# Patient Record
Sex: Male | Born: 1985 | Race: Black or African American | Hispanic: No | Marital: Single | Smoking: Current every day smoker
Health system: Southern US, Community
[De-identification: ages and names within clinical notes are randomized; demographics above are authoritative.]

## PROBLEM LIST (undated history)

## (undated) DIAGNOSIS — S42001A Fracture of unspecified part of right clavicle, initial encounter for closed fracture: Secondary | ICD-10-CM

## (undated) DIAGNOSIS — F329 Major depressive disorder, single episode, unspecified: Secondary | ICD-10-CM

## (undated) DIAGNOSIS — F32A Depression, unspecified: Secondary | ICD-10-CM

## (undated) DIAGNOSIS — F419 Anxiety disorder, unspecified: Secondary | ICD-10-CM

---
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
## 2009-01-03 ENCOUNTER — Emergency Department (HOSPITAL_COMMUNITY): Admission: EM | Admit: 2009-01-03 | Discharge: 2009-01-04 | Payer: Self-pay | Admitting: Emergency Medicine

## 2009-07-09 ENCOUNTER — Inpatient Hospital Stay (HOSPITAL_COMMUNITY): Admission: RE | Admit: 2009-07-09 | Discharge: 2009-07-12 | Payer: Self-pay | Admitting: Psychiatry

## 2009-07-09 ENCOUNTER — Ambulatory Visit: Payer: Self-pay | Admitting: Psychiatry

## 2009-07-12 ENCOUNTER — Emergency Department (HOSPITAL_COMMUNITY): Admission: EM | Admit: 2009-07-12 | Discharge: 2009-07-12 | Payer: Self-pay | Admitting: Emergency Medicine

## 2009-09-13 ENCOUNTER — Emergency Department (HOSPITAL_COMMUNITY): Admission: EM | Admit: 2009-09-13 | Discharge: 2009-09-13 | Payer: Self-pay | Admitting: Emergency Medicine

## 2009-09-15 ENCOUNTER — Other Ambulatory Visit: Payer: Self-pay

## 2009-09-15 ENCOUNTER — Inpatient Hospital Stay (HOSPITAL_COMMUNITY): Admission: RE | Admit: 2009-09-15 | Discharge: 2009-09-21 | Payer: Self-pay | Admitting: Psychiatry

## 2009-09-15 ENCOUNTER — Ambulatory Visit: Payer: Self-pay | Admitting: Psychiatry

## 2009-10-11 ENCOUNTER — Emergency Department (HOSPITAL_COMMUNITY): Admission: EM | Admit: 2009-10-11 | Discharge: 2009-10-15 | Payer: Self-pay | Admitting: Emergency Medicine

## 2010-11-20 LAB — CBC
HCT: 44 % (ref 39.0–52.0)
MCV: 89.7 fL (ref 78.0–100.0)
RBC: 4.91 MIL/uL (ref 4.22–5.81)
WBC: 7.1 10*3/uL (ref 4.0–10.5)

## 2010-11-20 LAB — DIFFERENTIAL
Basophils Absolute: 0 10*3/uL (ref 0.0–0.1)
Eosinophils Absolute: 0 10*3/uL (ref 0.0–0.7)
Lymphocytes Relative: 27 % (ref 12–46)
Lymphs Abs: 1.9 10*3/uL (ref 0.7–4.0)
Monocytes Absolute: 0.2 10*3/uL (ref 0.1–1.0)
Monocytes Relative: 2 % — ABNORMAL LOW (ref 3–12)

## 2010-11-20 LAB — BASIC METABOLIC PANEL
BUN: 10 mg/dL (ref 6–23)
CO2: 24 mEq/L (ref 19–32)
Calcium: 9.6 mg/dL (ref 8.4–10.5)
Creatinine, Ser: 0.97 mg/dL (ref 0.4–1.5)
GFR calc Af Amer: >60 mL/min (ref >60)

## 2010-11-20 LAB — TRICYCLICS SCREEN, URINE: TCA Scrn: NOT DETECTED

## 2010-11-20 LAB — HEMOCCULT GUIAC POC 1CARD (OFFICE): Fecal Occult Bld: NEGATIVE

## 2010-11-20 LAB — RAPID URINE DRUG SCREEN, HOSP PERFORMED: Benzodiazepines: NOT DETECTED

## 2010-11-24 LAB — BASIC METABOLIC PANEL
CO2: 25 mEq/L (ref 19–32)
Calcium: 9.6 mg/dL (ref 8.4–10.5)
GFR calc Af Amer: >60 mL/min (ref >60)
GFR calc non Af Amer: >60 mL/min (ref >60)
Glucose, Bld: 103 mg/dL — ABNORMAL HIGH (ref 70–99)
Potassium: 3.2 mEq/L — ABNORMAL LOW (ref 3.5–5.1)
Sodium: 143 mEq/L (ref 135–145)

## 2010-11-24 LAB — CBC
MCHC: 33.9 g/dL (ref 30.0–36.0)
RBC: 4.65 MIL/uL (ref 4.22–5.81)

## 2010-11-24 LAB — DIFFERENTIAL
Basophils Absolute: 0 10*3/uL (ref 0.0–0.1)
Basophils Relative: 1 % (ref 0–1)
Eosinophils Absolute: 0.2 10*3/uL (ref 0.0–0.7)
Eosinophils Relative: 3 % (ref 0–5)
Lymphs Abs: 2.7 10*3/uL (ref 0.7–4.0)
Monocytes Absolute: 0.6 10*3/uL (ref 0.1–1.0)
Neutro Abs: 4.1 10*3/uL (ref 1.7–7.7)

## 2010-11-24 LAB — RAPID URINE DRUG SCREEN, HOSP PERFORMED
Barbiturates: NOT DETECTED
Benzodiazepines: NOT DETECTED
Cocaine: NOT DETECTED

## 2010-11-24 LAB — TRICYCLICS SCREEN, URINE: TCA Scrn: NOT DETECTED

## 2010-12-07 LAB — COMPREHENSIVE METABOLIC PANEL
ALT: 21 U/L (ref 0–53)
Alkaline Phosphatase: 69 U/L (ref 39–117)
Calcium: 9.9 mg/dL (ref 8.4–10.5)
Chloride: 108 mEq/L (ref 96–112)
GFR calc Af Amer: >60 mL/min (ref >60)
Glucose, Bld: 95 mg/dL (ref 70–99)
Potassium: 4 mEq/L (ref 3.5–5.1)
Sodium: 141 mEq/L (ref 135–145)

## 2010-12-07 LAB — GC/CHLAMYDIA PROBE AMP, URINE
Chlamydia, Swab/Urine, PCR: NEGATIVE
GC Probe Amp, Urine: NEGATIVE

## 2010-12-07 LAB — URINALYSIS, ROUTINE W REFLEX MICROSCOPIC
Bilirubin Urine: NEGATIVE
Glucose, UA: NEGATIVE mg/dL
Hgb urine dipstick: NEGATIVE
Protein, ur: NEGATIVE mg/dL
Urobilinogen, UA: 0.2 mg/dL (ref 0.0–1.0)

## 2010-12-07 LAB — DRUGS OF ABUSE SCREEN W/O ALC, ROUTINE URINE
Amphetamine Screen, Ur: NEGATIVE
Barbiturate Quant, Ur: NEGATIVE
Creatinine,U: 227.3 mg/dL
Marijuana Metabolite: NEGATIVE
Methadone: NEGATIVE
Opiate Screen, Urine: NEGATIVE
Phencyclidine (PCP): NEGATIVE
Propoxyphene: NEGATIVE

## 2010-12-07 LAB — CBC
HCT: 45.3 % (ref 39.0–52.0)
MCHC: 33.2 g/dL (ref 30.0–36.0)
MCV: 90.1 fL (ref 78.0–100.0)
Platelets: 213 10*3/uL (ref 150–400)
RDW: 13.1 % (ref 11.5–15.5)

## 2010-12-07 LAB — RPR: RPR Ser Ql: NONREACTIVE
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
## 2012-02-27 ENCOUNTER — Emergency Department (HOSPITAL_COMMUNITY)
Admission: EM | Admit: 2012-02-27 | Discharge: 2012-02-27 | Disposition: A | Discharge status: Home / Self Care | Payer: Medicaid Other | Attending: Emergency Medicine | Admitting: Emergency Medicine

## 2012-02-27 ENCOUNTER — Encounter (HOSPITAL_COMMUNITY): Payer: Self-pay

## 2012-02-27 DIAGNOSIS — F3289 Other specified depressive episodes: Secondary | ICD-10-CM | POA: Insufficient documentation

## 2012-02-27 DIAGNOSIS — F329 Major depressive disorder, single episode, unspecified: Secondary | ICD-10-CM | POA: Insufficient documentation

## 2012-02-27 DIAGNOSIS — K120 Recurrent oral aphthae: Secondary | ICD-10-CM | POA: Insufficient documentation

## 2012-02-27 DIAGNOSIS — F17200 Nicotine dependence, unspecified, uncomplicated: Secondary | ICD-10-CM | POA: Insufficient documentation

## 2012-02-27 HISTORY — DX: Major depressive disorder, single episode, unspecified: F32.9

## 2012-02-27 HISTORY — DX: Depression, unspecified: F32.A

## 2012-02-27 HISTORY — DX: Fracture of unspecified part of right clavicle, initial encounter for closed fracture: S42.001A

## 2012-02-27 NOTE — ED Notes (Signed)
sts he thinks someone gave him something. Feels blisters on inside of mouth to both mucosal areas of inner cheeks.

## 2012-02-27 NOTE — ED Provider Notes (Signed)
History     CSN: [id number]  Arrival date & time 02/27/12  4098   First MD Initiated Contact with Patient 02/27/12 0830      Chief Complaint  Patient presents with  . oral blister     (Consider location/radiation/quality/duration/timing/severity/associated sxs/prior treatment) Patient is a 26 y.o. male presenting with mouth sores. The history is provided by the patient.  Mouth Lesions  The current episode started 3 to 5 days ago. The onset was gradual. The problem occurs continuously. The problem has been gradually worsening. The problem is mild. Nothing relieves the symptoms. Nothing aggravates the symptoms. Associated symptoms include mouth sores and rash (in mouth). Pertinent negatives include no fever, no abdominal pain, no constipation, no diarrhea, no nausea, no vomiting, no headaches, no neck pain, no cough and no wheezing. He has been behaving normally. He has been eating and drinking normally.    Past Medical History  Diagnosis Date  . Right clavicle fracture   . Depression     History reviewed. No pertinent past surgical history.  No family history on file.  History  Substance Use Topics  . Smoking status: Current Everyday Smoker -- 1.0 packs/day    Types: Cigarettes  . Smokeless tobacco: Not on file  . Alcohol Use: Yes     occasionally      Review of Systems  Constitutional: Negative for fever, chills and diaphoresis.  HENT: Positive for mouth sores. Negative for neck pain.   Respiratory: Negative for cough, chest tightness, shortness of breath and wheezing.   Cardiovascular: Negative for chest pain, palpitations and leg swelling.  Gastrointestinal: Negative for nausea, vomiting, abdominal pain, diarrhea and constipation.  Genitourinary: Negative for dysuria, frequency, discharge and difficulty urinating.  Skin: Positive for rash (in mouth). Negative for wound.  Neurological: Negative for dizziness, seizures, syncope, weakness, light-headedness, numbness  and headaches.  Hematological: Does not bruise/bleed easily.  All other systems reviewed and are negative.    Allergies  Review of patient's allergies indicates no known allergies.  Home Medications   Current Outpatient Rx  Name Route Sig Dispense Refill  . BUPROPION HCL 100 MG PO TABS Oral Take 100 mg by mouth 2 (two) times daily.      BP 127/70  Pulse 77  Temp 98.6 F (37 C) (Oral)  Resp 18  Ht 5\' 3"  (1.6 m)  Wt 133 lb (60.328 kg)  BMI 23.56 kg/m2  SpO2 100%  Physical Exam  Nursing note and vitals reviewed. Constitutional: He appears well-developed and well-nourished.  HENT:  Head: Normocephalic and atraumatic.  Right Ear: External ear normal.  Left Ear: External ear normal.  Nose: Nose normal.  Mouth/Throat: Oropharynx is clear and moist. No oropharyngeal exudate.  Eyes: Conjunctivae are normal. Pupils are equal, round, and reactive to light.  Neck: Normal range of motion. Neck supple.  Cardiovascular: Normal rate, regular rhythm, normal heart sounds and intact distal pulses.   Pulmonary/Chest: Effort normal and breath sounds normal. No respiratory distress. He has no wheezes. He has no rales. He exhibits no tenderness.  Musculoskeletal: Normal range of motion. He exhibits no edema and no tenderness.  Neurological: He is alert.  Skin: Skin is warm and dry. No rash noted. No erythema. No pallor.       Bilateral, symmetric 1.5 cm white ridges along bilateral molars   Psychiatric: He has a normal mood and affect. His behavior is normal. Judgment and thought content normal.    ED Course  Procedures (including critical care time)  Labs Reviewed - No data to display No results found.   1. Aphthous stomatitis       MDM  26 yo immunocompetent male presents for 4 days of symmetric bilateral oral sores, which appear on exam to be likely from his molars. Clinical picture not concerning for HSV or other infection. Patient counseled on use of topical mouthwashes for  symptomatic treatment. Patient given return precautions, including worsening of signs or symptoms. Patient instructed to follow-up with primary care physician.          Clemetine Marker, MD 02/27/12 [phone]

## 2012-02-27 NOTE — Discharge Instructions (Signed)
Stomatitis  Stomatitis is an inflammation of the mucous lining of the mouth. It can affect part of the mouth or the whole mouth. The intensity of symptoms can range from mild to severe. It can affect your cheek, teeth, gums, lips, or tongue. In almost all cases, the lining of the mouth becomes swollen, red, and painful. Painful ulcers can develop in your mouth. Stomatitis recurs in some people.  CAUSES   There are many common causes of stomatitis. They include:   Viruses (such as cold sores or shingles).   Canker sores.   Bacteria (such as ulcerative gingivitis or sexually transmitted diseases).   Fungus or yeast (such as candidiasis or oral thrush).   Poor oral hygiene and poor nutrition (Vincent's stomatitis or trench mouth).   Lack of vitamin B, vitamin C, or niacin.   Dentures or braces that do not fit properly.   High acid foods (uncommon).   Sharp or broken teeth.   Cheek biting.   Breathing through the mouth.   Chewing tobacco.   Allergy to toothpaste, mouthwash, candy, gum, lipstick, or some medicines.   Burning your mouth with hot drinks or food.   Exposure to dyes, heavy metals, acid fumes, or mineral dust.  SYMPTOMS    Painful ulcers in the mouth.   Blisters in the mouth.   Bleeding gums.   Swollen gums.   Irritability.   Bad breath.   Bad taste in the mouth.   Fever.   Trouble eating because of burning and pain in the mouth.  DIAGNOSIS   Your caregiver will examine your mouth and look for bleeding gums and mouth ulcers. Your caregiver may ask you about the medicines you are taking. Your caregiver may suggest a blood test and tissue sample (biopsy) of the mouth ulcer or mass if either is present. This will help find the cause of your condition.  TREATMENT   Your treatment will depend on the cause of your condition. Your caregiver will first try to treat your symptoms.    You may be given pain medicine. Topical anesthetic may be used to numb the area if you have severe  pain.   Your caregiver may prescribe antibiotic medicine if you have a bacterial infection.   Your caregiver may prescribe antifungal medicine if you have a fungal infection.   You may need to take antiviral medicine if you have a viral infection like herpes.   You may be asked to use medicated mouth rinses.   Your caregiver will advise you about proper brushing and using a soft toothbrush. You also need to get your teeth cleaned regularly.  HOME CARE INSTRUCTIONS    Maintain good oral hygiene. This is especially important for transplant patients.   Brush your teeth carefully with a soft, nylon-bristled toothbrush.   Floss at least 2 times a day.   Clean your mouth after eating.   Rinse your mouth with salt water 3 to 4 times a day.   Gargle with cold water.   Use topical numbing medicines to decrease pain if recommended by your caregiver.   Stop smoking, and stop using chewing or smokeless tobacco.   Avoid eating hot and spicy foods.   Eat soft and bland food.   Reduce your stress wherever possible.   Eat healthy and nutritious foods.  SEEK MEDICAL CARE IF:    Your symptoms persist or get worse.   You develop new symptoms.   Your mouth ulcers are present for more than 3   You have increasing difficulty with normal eating and drinking.   You have increasing fatigue or weakness.   You develop loss of appetite or nausea.  SEEK IMMEDIATE MEDICAL CARE IF:   You have a fever.   You develop pain, redness, or sores around one or both eyes.   You cannot eat or drink because of pain or other symptoms.   You develop worsening weakness, or you faint.   You develop vomiting or diarrhea.   You develop chest pain, shortness of breath, or rapid and irregular heartbeats.  MAKE SURE YOU:  Understand these instructions.   Will watch your condition.   Will get help right away if you are not doing well or get  worse.  Document Released: 06/18/2007 Document Revised: 08/10/2011 Document Reviewed: 03/30/2011 [facility] Patient Information 2012 [place], [place].Canker Sores  Canker sores are painful, open sores on the inside of the mouth and cheek. They may be white or yellow. The sores usually heal in 1 to 2 weeks. Women are more likely than men to have recurrent canker sores. CAUSES The cause of canker sores is not well understood. More than one cause is likely. Canker sores do not appear to be caused by certain types of germs (viruses or bacteria). Canker sores may be caused by:  An allergic reaction to certain foods.   Digestive problems.   Not having enough vitamin B12, folic acid, and iron.   Male sex hormones. Sores may come only during certain phases of a menstrual cycle. Often, there is improvement during pregnancy.   Genetics. Some people seem to inherit canker sore problems.  Emotional stress and injuries to the mouth may trigger outbreaks, but not cause them.  DIAGNOSIS Canker sores are diagnosed by exam.  TREATMENT  Patients who have frequent bouts of canker sores may have cultures taken of the sores, blood tests, or allergy tests. This helps determine if their sores are caused by a poor diet, an allergy, or some other preventable or treatable disease.   Vitamins may prevent recurrences or reduce the severity of canker sores in people with poor nutrition.   Numbing ointments can relieve pain. These are available in drug stores without a prescription.   Anti-inflammatory steroid mouth rinses or gels may be prescribed by your caregiver for severe sores.   Oral steroids may be prescribed if you have severe, recurrent canker sores. These strong medicines can cause many side effects and should be used only under the close direction of a dentist or physician.   Mouth rinses containing the antibiotic medicine may be prescribed. They may lessen symptoms and speed healing.  Healing usually  happens in about 1 or 2 weeks with or without treatment. Certain antibiotic mouth rinses given to pregnant women and young children can permanently stain teeth. Talk to your caregiver about your treatment. HOME CARE INSTRUCTIONS   Avoid foods that cause canker sores for you.   Avoid citrus juices, spicy or salty foods, and coffee until the sores are healed.   Use a soft-bristled toothbrush.   Chew your food carefully to avoid biting your cheek.   Apply topical numbing medicine to the sore to help relieve pain.   Apply a thin paste of baking soda and water to the sore to help heal the sore.   Only use mouth rinses or medicines for pain or discomfort as directed by your caregiver.  SEEK MEDICAL CARE IF:   Your symptoms are not better in 1 week.   Your sores are  still present after 2 weeks.   Your sores are very painful.   You have trouble breathing or swallowing.   Your sores come back frequently.  Document Released: 12/16/2010 Document Revised: 08/10/2011 Document Reviewed: 12/16/2010 [facility] Patient Information 2012 [employer]  YOU MAY USE SPRAYS CONTAINING BENZOCAINE TO HELP THE PAIN. DO NOT SWALLOW THESE SOLUTIONS; MAKE SURE TO SPIT THEM OUT. FOLLOW INSTRUCTIONS ON LABEL.

## 2012-02-27 NOTE — ED Provider Notes (Signed)
I saw and evaluated the patient, reviewed the resident's note and I agree with the findings and plan.  26yM with oral lesions. Not painful.  On exam small symmetric essentially linear lesions in horizontal plane in area next to 2nd molars b/l. No bleeding. No ulceration. No other oral lesions noted. Posterior pharynx clear. Neck supple and without adenopathy. Submental tissues soft. Dentition intact. Given appearance, locations and symmetry I suspect that this is just local irritation from adjacent molars. Lesions appear benign. Plan symptomatic tx with salt water rinses and monitoring. Return precautions discussed.  Raeford Razor, MD 02/27/12 [phone]

## 2012-03-04 NOTE — ED Provider Notes (Signed)
I saw and evaluated the patient, reviewed the resident's note and I agree with the findings and plan.  Please see completed note for this encounter.  Raeford Razor, MD 03/04/12 [phone]

## 2012-12-09 ENCOUNTER — Emergency Department (HOSPITAL_COMMUNITY)
Admission: EM | Admit: 2012-12-09 | Discharge: 2012-12-09 | Disposition: A | Discharge status: Home / Self Care | Payer: Medicaid Other | Attending: Emergency Medicine | Admitting: Emergency Medicine

## 2012-12-09 ENCOUNTER — Encounter (HOSPITAL_COMMUNITY): Payer: Self-pay | Admitting: Family Medicine

## 2012-12-09 DIAGNOSIS — F22 Delusional disorders: Secondary | ICD-10-CM

## 2012-12-09 DIAGNOSIS — F3289 Other specified depressive episodes: Secondary | ICD-10-CM | POA: Insufficient documentation

## 2012-12-09 DIAGNOSIS — F329 Major depressive disorder, single episode, unspecified: Secondary | ICD-10-CM

## 2012-12-09 DIAGNOSIS — F17200 Nicotine dependence, unspecified, uncomplicated: Secondary | ICD-10-CM | POA: Insufficient documentation

## 2012-12-09 DIAGNOSIS — F32A Depression, unspecified: Secondary | ICD-10-CM

## 2012-12-09 DIAGNOSIS — F419 Anxiety disorder, unspecified: Secondary | ICD-10-CM

## 2012-12-09 DIAGNOSIS — F411 Generalized anxiety disorder: Secondary | ICD-10-CM | POA: Insufficient documentation

## 2012-12-09 DIAGNOSIS — Z79899 Other long term (current) drug therapy: Secondary | ICD-10-CM | POA: Insufficient documentation

## 2012-12-09 DIAGNOSIS — R45851 Suicidal ideations: Secondary | ICD-10-CM | POA: Insufficient documentation

## 2012-12-09 DIAGNOSIS — Z8781 Personal history of (healed) traumatic fracture: Secondary | ICD-10-CM | POA: Insufficient documentation

## 2012-12-09 HISTORY — DX: Anxiety disorder, unspecified: F41.9

## 2012-12-09 LAB — COMPREHENSIVE METABOLIC PANEL
Albumin: 4.3 g/dL (ref 3.5–5.2)
BUN: 18 mg/dL (ref 6–23)
Chloride: 103 mEq/L (ref 96–112)
Creatinine, Ser: 1 mg/dL (ref 0.50–1.35)
GFR calc Af Amer: >90 mL/min (ref >90)
Total Bilirubin: 0.3 mg/dL (ref 0.3–1.2)
Total Protein: 7.3 g/dL (ref 6.0–8.3)

## 2012-12-09 LAB — ETHANOL: Alcohol, Ethyl (B): <11 mg/dL (ref 0–11)

## 2012-12-09 LAB — RAPID URINE DRUG SCREEN, HOSP PERFORMED: Benzodiazepines: NOT DETECTED

## 2012-12-09 LAB — CBC
HCT: 45.4 % (ref 39.0–52.0)
MCHC: 33.9 g/dL (ref 30.0–36.0)
MCV: 88 fL (ref 78.0–100.0)
RDW: 12.3 % (ref 11.5–15.5)

## 2012-12-09 LAB — URINALYSIS, ROUTINE W REFLEX MICROSCOPIC
Glucose, UA: NEGATIVE mg/dL
Ketones, ur: NEGATIVE mg/dL
Leukocytes, UA: NEGATIVE
Specific Gravity, Urine: 1.03 (ref 1.005–1.030)
pH: 5 (ref 5.0–8.0)

## 2012-12-09 LAB — SALICYLATE LEVEL: Salicylate Lvl: <2 mg/dL — ABNORMAL LOW (ref 2.8–20.0)

## 2012-12-09 MED ORDER — RISPERIDONE 2 MG PO TABS
4.0000 mg | ORAL_TABLET | Freq: Every day | ORAL | Status: DC
  Administered 2012-12-09: 4 mg via ORAL
  Filled 2012-12-09: qty 2

## 2012-12-09 MED ORDER — ARIPIPRAZOLE 5 MG PO TABS
5.0000 mg | ORAL_TABLET | Freq: Every day | ORAL | Status: DC
  Administered 2012-12-09: 5 mg via ORAL
  Filled 2012-12-09: qty 1

## 2012-12-09 MED ORDER — IBUPROFEN 200 MG PO TABS: 600.0000 mg | ORAL_TABLET | Freq: Three times a day (TID) | ORAL | Status: DC | PRN

## 2012-12-09 MED ORDER — NICOTINE 21 MG/24HR TD PT24
21.0000 mg | MEDICATED_PATCH | Freq: Every day | TRANSDERMAL | Status: DC
  Administered 2012-12-09: 21 mg via TRANSDERMAL
  Filled 2012-12-09: qty 1

## 2012-12-09 MED ORDER — ACETAMINOPHEN 325 MG PO TABS: 650.0000 mg | ORAL_TABLET | ORAL | Status: DC | PRN

## 2012-12-09 MED ORDER — ONDANSETRON HCL 4 MG PO TABS
4.0000 mg | ORAL_TABLET | Freq: Three times a day (TID) | ORAL | Status: DC | PRN
  Administered 2012-12-09: 4 mg via ORAL
  Filled 2012-12-09: qty 1

## 2012-12-09 MED ORDER — ZOLPIDEM TARTRATE 5 MG PO TABS: 5.0000 mg | ORAL_TABLET | Freq: Every evening | ORAL | Status: DC | PRN

## 2012-12-09 MED ORDER — ALUM & MAG HYDROXIDE-SIMETH 200-200-20 MG/5ML PO SUSP: 30.0000 mL | ORAL | Status: DC | PRN

## 2012-12-09 NOTE — [facility] Counselor (Signed)
Per telepsych recommendations; discharge home. Patient already has an established psychiatrist. Clinical research associate provided patient with additional follow up referrals.

## 2012-12-09 NOTE — ED Notes (Signed)
2 belongings bags located in [place] locker #[phone]
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
## 2012-12-09 NOTE — ED Notes (Signed)
Pt changed in to blue paper scrubs. Pt search and wanded by security. Pt with 1 pt belongings bag
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
## 2012-12-09 NOTE — ED Provider Notes (Signed)
The patient was assessed by psychiatry via teleconference.  Patient is stable for discharge. No inpatient psychiatric treatment was recommended.  Celene Kras, MD 12/09/12 1055

## 2012-12-10 NOTE — ED Provider Notes (Signed)
Medical screening examination/treatment/procedure(s) were performed by non-physician practitioner and as supervising physician I was immediately available for consultation/collaboration.  Sunnie Nielsen, MD 12/10/12 [phone]

## 2013-01-22 ENCOUNTER — Emergency Department (HOSPITAL_COMMUNITY): Payer: Medicaid Other

## 2013-01-22 ENCOUNTER — Emergency Department (HOSPITAL_COMMUNITY)
Admission: EM | Admit: 2013-01-22 | Discharge: 2013-01-23 | Disposition: A | Discharge status: Home / Self Care | Payer: Medicaid Other | Attending: Emergency Medicine | Admitting: Emergency Medicine

## 2013-01-22 DIAGNOSIS — Z79899 Other long term (current) drug therapy: Secondary | ICD-10-CM | POA: Insufficient documentation

## 2013-01-22 DIAGNOSIS — S79929A Unspecified injury of unspecified thigh, initial encounter: Secondary | ICD-10-CM | POA: Insufficient documentation

## 2013-01-22 DIAGNOSIS — F3289 Other specified depressive episodes: Secondary | ICD-10-CM | POA: Insufficient documentation

## 2013-01-22 DIAGNOSIS — Z23 Encounter for immunization: Secondary | ICD-10-CM | POA: Insufficient documentation

## 2013-01-22 DIAGNOSIS — S40812A Abrasion of left upper arm, initial encounter: Secondary | ICD-10-CM

## 2013-01-22 DIAGNOSIS — F329 Major depressive disorder, single episode, unspecified: Secondary | ICD-10-CM | POA: Insufficient documentation

## 2013-01-22 DIAGNOSIS — IMO0002 Reserved for concepts with insufficient information to code with codable children: Secondary | ICD-10-CM | POA: Insufficient documentation

## 2013-01-22 DIAGNOSIS — M79652 Pain in left thigh: Secondary | ICD-10-CM

## 2013-01-22 DIAGNOSIS — Y9389 Activity, other specified: Secondary | ICD-10-CM | POA: Insufficient documentation

## 2013-01-22 DIAGNOSIS — S50811A Abrasion of right forearm, initial encounter: Secondary | ICD-10-CM

## 2013-01-22 DIAGNOSIS — S79919A Unspecified injury of unspecified hip, initial encounter: Secondary | ICD-10-CM | POA: Insufficient documentation

## 2013-01-22 DIAGNOSIS — S0081XA Abrasion of other part of head, initial encounter: Secondary | ICD-10-CM

## 2013-01-22 DIAGNOSIS — Z8781 Personal history of (healed) traumatic fracture: Secondary | ICD-10-CM | POA: Insufficient documentation

## 2013-01-22 DIAGNOSIS — S50812A Abrasion of left forearm, initial encounter: Secondary | ICD-10-CM

## 2013-01-22 DIAGNOSIS — S0990XA Unspecified injury of head, initial encounter: Secondary | ICD-10-CM | POA: Insufficient documentation

## 2013-01-22 DIAGNOSIS — Y92410 Unspecified street and highway as the place of occurrence of the external cause: Secondary | ICD-10-CM | POA: Insufficient documentation

## 2013-01-22 DIAGNOSIS — F17200 Nicotine dependence, unspecified, uncomplicated: Secondary | ICD-10-CM | POA: Insufficient documentation

## 2013-01-22 DIAGNOSIS — F411 Generalized anxiety disorder: Secondary | ICD-10-CM | POA: Insufficient documentation

## 2013-01-22 MED ORDER — TETANUS-DIPHTH-ACELL PERTUSSIS 5-2.5-18.5 LF-MCG/0.5 IM SUSP
0.5000 mL | Freq: Once | INTRAMUSCULAR | Status: AC
  Administered 2013-01-22: 0.5 mL via INTRAMUSCULAR
  Filled 2013-01-22: qty 0.5

## 2013-01-22 MED ORDER — NAPROXEN 500 MG PO TABS: 500.0000 mg | ORAL_TABLET | Freq: Two times a day (BID) | ORAL | Status: DC

## 2013-01-22 NOTE — ED Notes (Signed)
Pt requesting meal tray.  Dr. Preston Fleeting states that the pt can have PO's.  Bag meal provided.  No changes in pt condition (stable).  No distress noted.  Pt continues to have a agitated demeanor but is still cooperative.

## 2013-01-22 NOTE — ED Notes (Signed)
Patient transported to X-ray 
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
## 2013-01-22 NOTE — ED Notes (Signed)
[employer] in with pt.  Pt loc no changes.  Pt is able to answer all questions appropriately.  Pt is agitated but cooperating.
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
## 2013-01-22 NOTE — Progress Notes (Signed)
Met pt shortly after arrival and offered to make calls for him. He was firm that he didn't want anyone called.  Marjory Lies Chaplain  01/22/13 1600  Clinical Encounter Type  Visited With Patient

## 2013-01-31 ENCOUNTER — Telehealth (HOSPITAL_COMMUNITY): Payer: Self-pay | Admitting: Licensed Clinical Social Worker

## 2013-01-31 ENCOUNTER — Emergency Department (HOSPITAL_COMMUNITY)
Admission: EM | Admit: 2013-01-31 | Discharge: 2013-01-31 | Disposition: A | Discharge status: Other Acute Inpatient Hospital | Payer: Medicaid Other | Attending: Emergency Medicine | Admitting: Emergency Medicine

## 2013-01-31 ENCOUNTER — Inpatient Hospital Stay (HOSPITAL_COMMUNITY)
Admission: AD | Admit: 2013-01-31 | Discharge: 2013-02-07 | DRG: 885 | Disposition: A | Discharge status: Home / Self Care | Payer: Medicaid Other | Source: Intra-hospital | Attending: Psychiatry | Admitting: Psychiatry

## 2013-01-31 ENCOUNTER — Encounter (HOSPITAL_COMMUNITY): Payer: Self-pay

## 2013-01-31 DIAGNOSIS — F259 Schizoaffective disorder, unspecified: Principal | ICD-10-CM | POA: Diagnosis present

## 2013-01-31 DIAGNOSIS — F411 Generalized anxiety disorder: Secondary | ICD-10-CM | POA: Insufficient documentation

## 2013-01-31 DIAGNOSIS — Z8781 Personal history of (healed) traumatic fracture: Secondary | ICD-10-CM | POA: Insufficient documentation

## 2013-01-31 DIAGNOSIS — F319 Bipolar disorder, unspecified: Secondary | ICD-10-CM | POA: Insufficient documentation

## 2013-01-31 DIAGNOSIS — Z9119 Patient's noncompliance with other medical treatment and regimen: Secondary | ICD-10-CM

## 2013-01-31 DIAGNOSIS — S51009A Unspecified open wound of unspecified elbow, initial encounter: Secondary | ICD-10-CM

## 2013-01-31 DIAGNOSIS — F29 Unspecified psychosis not due to a substance or known physiological condition: Secondary | ICD-10-CM | POA: Diagnosis present

## 2013-01-31 DIAGNOSIS — Z79899 Other long term (current) drug therapy: Secondary | ICD-10-CM

## 2013-01-31 DIAGNOSIS — Z91199 Patient's noncompliance with other medical treatment and regimen due to unspecified reason: Secondary | ICD-10-CM

## 2013-01-31 DIAGNOSIS — J40 Bronchitis, not specified as acute or chronic: Secondary | ICD-10-CM

## 2013-01-31 DIAGNOSIS — F3010 Manic episode without psychotic symptoms, unspecified: Secondary | ICD-10-CM

## 2013-01-31 DIAGNOSIS — R443 Hallucinations, unspecified: Secondary | ICD-10-CM | POA: Insufficient documentation

## 2013-01-31 DIAGNOSIS — F17200 Nicotine dependence, unspecified, uncomplicated: Secondary | ICD-10-CM | POA: Insufficient documentation

## 2013-01-31 LAB — RAPID URINE DRUG SCREEN, HOSP PERFORMED
Barbiturates: NOT DETECTED
Tetrahydrocannabinol: NOT DETECTED

## 2013-01-31 LAB — COMPREHENSIVE METABOLIC PANEL
ALT: 22 U/L (ref 0–53)
Alkaline Phosphatase: 80 U/L (ref 39–117)
CO2: 24 mEq/L (ref 19–32)
Calcium: 9.5 mg/dL (ref 8.4–10.5)
GFR calc Af Amer: >90 mL/min (ref >90)
GFR calc non Af Amer: >90 mL/min (ref >90)
Glucose, Bld: 109 mg/dL — ABNORMAL HIGH (ref 70–99)
Sodium: 141 mEq/L (ref 135–145)
Total Bilirubin: 0.5 mg/dL (ref 0.3–1.2)

## 2013-01-31 LAB — CBC
Hemoglobin: 13.7 g/dL (ref 13.0–17.0)
MCH: 28.6 pg (ref 26.0–34.0)
RBC: 4.79 MIL/uL (ref 4.22–5.81)

## 2013-01-31 MED ORDER — MENTHOL 3 MG MT LOZG
1.0000 | LOZENGE | OROMUCOSAL | Status: DC | PRN
  Administered 2013-02-03 – 2013-02-05 (×2): 3 mg via ORAL

## 2013-01-31 MED ORDER — IBUPROFEN 600 MG PO TABS: 600.0000 mg | ORAL_TABLET | Freq: Three times a day (TID) | ORAL | Status: DC | PRN

## 2013-01-31 MED ORDER — TRAZODONE HCL 50 MG PO TABS: 50.0000 mg | ORAL_TABLET | Freq: Every evening | ORAL | Status: DC | PRN

## 2013-01-31 MED ORDER — CARBAMAZEPINE ER 100 MG PO TB12
100.0000 mg | ORAL_TABLET | Freq: Two times a day (BID) | ORAL | Status: DC
  Filled 2013-01-31 (×4): qty 1

## 2013-01-31 MED ORDER — DEXTROMETHORPHAN POLISTIREX 30 MG/5ML PO LQCR
30.0000 mg | Freq: Two times a day (BID) | ORAL | Status: AC
  Administered 2013-02-01 – 2013-02-05 (×9): 30 mg via ORAL
  Filled 2013-01-31 (×12): qty 5

## 2013-01-31 MED ORDER — LORAZEPAM 2 MG/ML IJ SOLN: 1.0000 mg | Freq: Three times a day (TID) | INTRAMUSCULAR | Status: DC | PRN

## 2013-01-31 MED ORDER — ALUM & MAG HYDROXIDE-SIMETH 200-200-20 MG/5ML PO SUSP: 30.0000 mL | ORAL | Status: DC | PRN

## 2013-01-31 MED ORDER — CARBAMAZEPINE ER 100 MG PO TB12
100.0000 mg | ORAL_TABLET | Freq: Two times a day (BID) | ORAL | Status: DC
  Administered 2013-02-01: 100 mg via ORAL
  Filled 2013-01-31 (×5): qty 1

## 2013-01-31 MED ORDER — SILVER SULFADIAZINE 1 % EX CREA
TOPICAL_CREAM | Freq: Two times a day (BID) | CUTANEOUS | Status: DC
  Administered 2013-02-01 – 2013-02-03 (×5): via TOPICAL
  Filled 2013-01-31: qty 85
  Filled 2013-01-31: qty 50
  Filled 2013-01-31: qty 85

## 2013-01-31 MED ORDER — LORAZEPAM 1 MG PO TABS
1.0000 mg | ORAL_TABLET | Freq: Three times a day (TID) | ORAL | Status: DC | PRN
  Filled 2013-01-31: qty 1

## 2013-01-31 MED ORDER — RISPERIDONE 1 MG PO TABS: 1.0000 mg | ORAL_TABLET | Freq: Two times a day (BID) | ORAL | Status: DC

## 2013-01-31 MED ORDER — ZOLPIDEM TARTRATE 5 MG PO TABS: 5.0000 mg | ORAL_TABLET | Freq: Every evening | ORAL | Status: DC | PRN

## 2013-01-31 MED ORDER — MAGNESIUM HYDROXIDE 400 MG/5ML PO SUSP: 30.0000 mL | Freq: Every day | ORAL | Status: DC | PRN

## 2013-01-31 MED ORDER — NICOTINE 21 MG/24HR TD PT24
21.0000 mg | MEDICATED_PATCH | Freq: Every day | TRANSDERMAL | Status: DC
  Administered 2013-01-31: 21 mg via TRANSDERMAL

## 2013-01-31 MED ORDER — RISPERIDONE 1 MG PO TABS
1.0000 mg | ORAL_TABLET | Freq: Two times a day (BID) | ORAL | Status: DC
  Administered 2013-02-01 – 2013-02-02 (×4): 1 mg via ORAL
  Filled 2013-01-31 (×10): qty 1

## 2013-01-31 MED ORDER — LEVOFLOXACIN 500 MG PO TABS
500.0000 mg | ORAL_TABLET | Freq: Every day | ORAL | Status: AC
  Administered 2013-02-01 – 2013-02-06 (×6): 500 mg via ORAL
  Filled 2013-01-31 (×8): qty 1

## 2013-01-31 MED ORDER — ACETAMINOPHEN 325 MG PO TABS: 650.0000 mg | ORAL_TABLET | ORAL | Status: DC | PRN

## 2013-01-31 MED ORDER — MENTHOL 3 MG MT LOZG
1.0000 | LOZENGE | OROMUCOSAL | Status: DC | PRN
  Filled 2013-01-31: qty 9

## 2013-01-31 MED ORDER — ONDANSETRON HCL 4 MG PO TABS: 4.0000 mg | ORAL_TABLET | Freq: Three times a day (TID) | ORAL | Status: DC | PRN

## 2013-01-31 MED ORDER — ZIPRASIDONE MESYLATE 20 MG IM SOLR
20.0000 mg | Freq: Once | INTRAMUSCULAR | Status: AC
  Administered 2013-01-31: 20 mg via INTRAMUSCULAR
  Filled 2013-01-31: qty 20

## 2013-01-31 MED ORDER — SODIUM CHLORIDE 0.9 % IR SOLN
Freq: Two times a day (BID) | Status: AC
  Administered 2013-02-01 – 2013-02-05 (×8)
  Filled 2013-01-31 (×3): qty 500

## 2013-01-31 MED ORDER — ACETAMINOPHEN 325 MG PO TABS
650.0000 mg | ORAL_TABLET | Freq: Four times a day (QID) | ORAL | Status: DC | PRN
  Administered 2013-01-31: 650 mg via ORAL
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
## 2013-01-31 NOTE — ED Notes (Signed)
Writer explaining to patient unit rules and asking patient if he wants TV turned on. Writer also offering patient water to drink. Patient makes hand gestures to tell writer to  leave and states" shoo go ahead and leave". Writer tells patient that I'll be back with you a pitcher of water and remote to turn TV on.
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
## 2013-01-31 NOTE — BHH Counselor (Signed)
Patient accepted to [facility] by Julieanne Cotton, NP pending a 400 hall bed assignment.

## 2013-01-31 NOTE — ED Notes (Addendum)
Pt. In new blue scrubs. Pt. And belongings searched and wanded by security. Pt. Belongings locked up in the nurses station in triage. Pt. Has 1 belongings bag. Pt. Has shoes, shirt, pants, socks, underpants.pt. Has string necklace containing the symbol of a  Cross.

## 2013-01-31 NOTE — BHH Counselor (Signed)
Patient accepted to [facility] by Julieanne Cotton, NP to Dr. Jannifer Franklin. Patient's room assignment is 400-1. EDP-Dr. Linwood Dibbles made aware of patient's disposition. Patient's nurse-Patricia also made aware of patients disposition. The nursing report # is [phone]. All support paperwork completed and faxed to [facility]. Patient is under IVC and will need transport to [facility] [address] via [employer].
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
## 2013-01-31 NOTE — ED Provider Notes (Signed)
Filed Vitals:   01/31/13 0211  BP: 124/85  Pulse: 77  Temp:   Resp: 16   Sleeping this am.  Awaiting placement.  Celene Kras, MD 01/31/13 [phone]

## 2013-01-31 NOTE — ED Notes (Signed)
Writer in process of fixing patient water and getting remote and patient comes down hallway and [place], NT noted patient going into another patient room and throwing paper in trash can. MIchelle, NT tried to explain to patient to come out of room 43 and patient started yelling and exhibiting bizarre behavior. Patient told Marcelino Duster, [place] " to keep it rolling".
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
## 2013-01-31 NOTE — ED Notes (Signed)
Dr. Norlene Campbell contacted about patient behavior new orders received.

## 2013-01-31 NOTE — ED Provider Notes (Signed)
Medical screening examination/treatment/procedure(s) were performed by non-physician practitioner and as supervising physician I was immediately available for consultation/collaboration.  Nona Gracey M Shamon Cothran, MD 01/31/13 0712 

## 2013-02-01 MED ORDER — NICOTINE 21 MG/24HR TD PT24
21.0000 mg | MEDICATED_PATCH | Freq: Every day | TRANSDERMAL | Status: DC
  Administered 2013-02-01 – 2013-02-07 (×7): 21 mg via TRANSDERMAL
  Filled 2013-02-01 (×9): qty 1
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
## 2013-02-01 NOTE — Progress Notes (Signed)
Patient ID: Yaasir Menken, male   DOB: Mar 26, 1986, 26 y.o.   MRN: [id number] Psychoeducational Group Note  Date:  02/01/2013 Time:1000am  Group Topic/Focus:  Identifying Needs:   The focus of this group is to help patients identify their personal needs that have been historically problematic and identify healthy behaviors to address their needs.  Participation Level:  Active  Participation Quality:  Appropriate  Affect:  Anxious  Cognitive:  Appropriate  Insight:  Supportive  Engagement in Group:  Supportive  Additional Comments:  Inventory and Psychoeducational group   Valente David 02/01/2013,10:24 AM

## 2013-02-01 NOTE — Progress Notes (Addendum)
Patient ID: [name], male   DOB: May 19, 1986, 27 y.o.   MRN: [id number]  D: Pt denies SI/HI/AVH/ pain at this time. Pt is pleasant and cooperative. Pt still has pressured speech and is a little loud but is cooperative. Pt states he only will take Risperdal and Wellbutrin. Although pt denies AVH, pt appears to be experiencing internal stimuli.   A: Pt was offered support and encouragement. Pt was given scheduled medications. Pt was encourage to attend groups. Q 15 minute checks were done for safety.   R:Pt attends groups and interacts well with peers and staff. Pt is taking medication. Pt has no complaints at this time.Pt receptive to treatment and safety maintained on unit.

## 2013-02-01 NOTE — [facility] Group Notes (Signed)
[facility] Group Notes:  (Clinical Social Work)  02/01/2013  11:15-11:45AM  Summary of Progress/Problems:   The main focus of today's process group was for the patient to identify ways in which they have in the past sabotaged their own recovery and reasons they may have done this/what they received from doing it.  We then worked to identify a specific plan to avoid doing this when discharged from the hospital for this admission.  The patient expressed that he secludes himself and puts people off with the way he talks.  When he gets out of the hospital, he believes he can stay well if he keeps himself busy by going to the park, mall, etc. and "chilling,"  CSW suggested [facility] and he was very interested.  Type of Therapy:  Group Therapy - Process  Participation Level:  Active  Participation Quality:  Attentive and Sharing  Affect:  Blunted  Cognitive:  Appropriate and Oriented  Insight:  Developing/Improving  Engagement in Therapy:  Developing/Improving  Modes of Intervention:  Clarification, Education, Exploration, Discussion  Ambrose Mantle, LCSW 02/01/2013, 1:07 PM
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
## 2013-02-01 NOTE — Progress Notes (Signed)
D:  Patient irritable this morning, but has been much more pleasant as the day had progressed.  He has attended and participated in groups.  He initially refused medications this morning, but took them after speaking with Dr. Lolly Mustache.  He does appear to be responding to internal stimuli at times, but is more easily directed as the day has progressed.  A:  Medications given as scheduled.  Encouraged patient to attend groups and to come to staff with problems or concerns.  Dressing change done to wound on right forearm shortly after noon.  Wound is healing well and is without errythema or exudate.  Minimal drainage noted.   R:  Has become pleasant and cooperative as the day progressed.  Wound is healing well.  Interacting well with staff and peers at this time.  Safety is maintained on the unit.

## 2013-02-01 NOTE — [facility] Suicide Risk Assessment (Signed)
Suicide Risk Assessment  Admission Assessment     Nursing information obtained from:    Demographic factors:    Current Mental Status:    Loss Factors:    Historical Factors:    Risk Reduction Factors:     CLINICAL FACTORS:   Schizophrenia:   Less than 26 years old Paranoid or undifferentiated type Currently Psychotic Previous Psychiatric Diagnoses and Treatments  COGNITIVE FEATURES THAT CONTRIBUTE TO RISK:  Closed-mindedness Loss of executive function Polarized thinking Thought constriction (tunnel vision)    SUICIDE RISK:   Severe:  Frequent, intense, and enduring suicidal ideation, specific plan, no subjective intent, but some objective markers of intent (i.e., choice of lethal method), the method is accessible, some limited preparatory behavior, evidence of impaired self-control, severe dysphoria/symptomatology, multiple risk factors present, and few if any protective factors, particularly a lack of social support.  PLAN OF CARE:  I certify that inpatient services furnished can reasonably be expected to improve the patient's condition.  Meloney Feld T. 02/01/2013, 9:12 AM

## 2013-02-02 MED ORDER — RISPERIDONE 1 MG PO TABS
1.0000 mg | ORAL_TABLET | ORAL | Status: DC
  Administered 2013-02-02 – 2013-02-04 (×4): 1 mg via ORAL
  Filled 2013-02-02 (×8): qty 1

## 2013-02-02 MED ORDER — RISPERIDONE 1 MG PO TABS
1.0000 mg | ORAL_TABLET | ORAL | Status: DC
  Filled 2013-02-02: qty 1

## 2013-02-02 NOTE — Progress Notes (Signed)
Pt reports his sleep as well.  His appetite is good energy normal and ability to pay attention as improving.  He rated his his depression a 5 hopelessness 5 and anxiety a 3 on his self-inventory.  He denies any H/I he does admit to some passive S/I but has no plan here and does contact to come to staff.  Pt denies any A/V hallucinations. Pt plans to follow up with [facility] at time of discharge.

## 2013-02-02 NOTE — Progress Notes (Signed)
Patient ID: Shariq Puig, male   DOB: November 24, 1985, 27 y.o.   MRN: [id number] Psychoeducational Group Note  Date:  02/02/2013 Time:  1000am  Group Topic/Focus:  Making Healthy Choices:   The focus of this group is to help patients identify negative/unhealthy choices they were using prior to admission and identify positive/healthier coping strategies to replace them upon discharge.  Participation Level:  Active  Participation Quality:  Appropriate  Affect:  Appropriate  Cognitive:  Appropriate  Insight:  Supportive  Engagement in Group:  Supportive  Additional Comments:  Spiritual group   Valente David 02/02/2013,10:57 AM
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
## 2013-02-02 NOTE — Progress Notes (Signed)
D.  Pt. Denies SI/HI and denies A/V hallucinations.  Pt. Refused 1700 dose of Risperdal. A.  Q 15 minute checks continued for safety. R.  Pt. Remains safe.

## 2013-02-02 NOTE — [facility] Group Notes (Signed)
[facility] Group Notes: (Clinical Social Work)   02/02/2013      Type of Therapy:  Group Therapy   Participation Level:  Did Not Attend    Ambrose Mantle, LCSW 02/02/2013, 1:09 PM

## 2013-02-02 NOTE — Progress Notes (Signed)
[facility] MD Progress Note  02/02/2013 9:06 AM [name]  MRN:  [id number] Subjective:   I don't have any mental illness. Objective.   Patient seen and chart reviewed.  Patient continues to exhibit paranoia, hallucination and passive suicidal thinking.  He does not believe he has any psychiatric illness.  However he is compliant with the Risperdal.  He sleep on and off.  He was seen hypervigilant and guarded.  He was also seen some time talking to himself as appear he may be responding to internal stimuli.  He has attended some groups.  He has no tremors or shakes.  He did not provide much information about his past.  Diagnosis:   Axis I: Schizoaffective Disorder Axis II: Deferred Axis III:  Past Medical History  Diagnosis Date  . Right clavicle fracture   . Depression   . Anxiety    Axis IV: other psychosocial or environmental problems  ADL's:  Intact  Sleep: Fair  Appetite:  Fair  Suicidal Ideation:  Plan:  still passive suicidal thoughts Intent:  No Means:  Unknown Homicidal Ideation:  Plan:  Threatened to kill his mother. Intent:  None AEB (as evidenced by):  Psychiatric Specialty Exam: Review of Systems  Constitutional: Negative.   HENT: Negative.   Respiratory: Positive for cough. Negative for hemoptysis, sputum production, shortness of breath and wheezing.   Gastrointestinal: Negative.   Genitourinary: Negative.   Musculoskeletal: Negative.   Skin:       Multiple scratches but covered with bandages.   Neurological: Negative.   Psychiatric/Behavioral: Positive for hallucinations. The patient has insomnia.        Paranoid    Blood pressure 132/85, pulse 112, temperature 98.2 F (36.8 C), temperature source Oral, resp. rate 17, height 5\' 3"  (1.6 m), weight 58.06 kg (128 lb).Body mass index is 22.68 kg/(m^2).  General Appearance: Disheveled and Fairly Groomed  Patent attorney::  Fair  Speech:  Pressured  Volume:  Increased  Mood:  Irritable  Affect:  Constricted  and Labile  Thought Process:  Disorganized  Orientation:  Full (Time, Place, and Person)  Thought Content:  Hallucinations: Auditory and Paranoid Ideation  Suicidal Thoughts:  Yes.  without intent/plan  Homicidal Thoughts:  Yes.  without intent/plan  Memory:  Difficulty recalling things  Judgement:  Impaired  Insight:  Lacking  Psychomotor Activity:  Increased  Concentration:  Fair  Recall:  Poor  Akathisia:  No  Handed:  Right  AIMS (if indicated):     Assets:  Housing Physical Health  Sleep:  Number of Hours: 5.75   Current Medications: Current Facility-Administered Medications  Medication Dose Route Frequency Provider Last Rate Last Dose  . acetaminophen (TYLENOL) tablet 650 mg  650 mg Oral Q6H PRN Court Joy, PA-C   650 mg at 01/31/13 2227  . alum & mag hydroxide-simeth (MAALOX/MYLANTA) 200-200-20 MG/5ML suspension 30 mL  30 mL Oral Q4H PRN Earney Navy, NP      . dextromethorphan (DELSYM) 30 MG/5ML liquid 30 mg  30 mg Oral BID Court Joy, PA-C   30 mg at 02/02/13 1191  . hydrogen peroxide 3 % 10 application in sodium chloride irrigation 0.9 % 500 mL irrigation   Irrigation BID Court Joy, PA-C      . levofloxacin [facility]) tablet 500 mg  500 mg Oral Daily Court Joy, PA-C   500 mg at 02/02/13 4782  . LORazepam (ATIVAN) tablet 1 mg  1 mg Oral TID PRN Earney Navy, NP  Or  . LORazepam (ATIVAN) injection 1 mg  1 mg Intramuscular TID PRN Earney Navy, NP      . magnesium hydroxide (MILK OF MAGNESIA) suspension 30 mL  30 mL Oral Daily PRN Earney Navy, NP      . menthol-cetylpyridinium (CEPACOL) lozenge 3 mg  1 lozenge Oral PRN Earney Navy, NP      . nicotine (NICODERM CQ - dosed in mg/24 hours) patch 21 mg  21 mg Transdermal Daily Cleotis Nipper, MD   21 mg at 02/02/13 1610  . risperiDONE (RISPERDAL) tablet 1 mg  1 mg Oral BID Earney Navy, NP   1 mg at 02/02/13 9604  . silver sulfADIAZINE (SILVADENE) 1 % cream   Topical  BID Court Joy, PA-C      . traZODone (DESYREL) tablet 50 mg  50 mg Oral QHS PRN,MR X 1 Earney Navy, NP        Lab Results: No results found for this or any previous visit (from the past 48 hour(s)).  Physical Findings: AIMS: Facial and Oral Movements Muscles of Facial Expression: None, normal Lips and Perioral Area: None, normal Jaw: None, normal Tongue: None, normal,Extremity Movements Upper (arms, wrists, hands, fingers): None, normal Lower (legs, knees, ankles, toes): None, normal, Trunk Movements Neck, shoulders, hips: None, normal, Overall Severity Severity of abnormal movements (highest score from questions above): None, normal Incapacitation due to abnormal movements: None, normal Patient's awareness of abnormal movements (rate only patient's report): No Awareness, Dental Status Current problems with teeth and/or dentures?: No Does patient usually wear dentures?: No  CIWA:  CIWA-Ar Total: 0 COWS:  COWS Total Score: 1  Treatment Plan Summary: Daily contact with patient to assess and evaluate symptoms and progress in treatment Medication management We will continue Risperdal 1 mg at bedtime, trazodone 50 mg at bedtime.  At this time patient does not have any tremors or shakes.  Plan:  Medical Decision Making Problem Points:  Established problem, stable/improving (1), Review of last therapy session (1) and Review of psycho-social stressors (1) Data Points:  Review or order clinical lab tests (1) Review and summation of old records (2) Review of medication regiment & side effects (2)  I certify that inpatient services furnished can reasonably be expected to improve the patient's condition.   Kenneth Viswanathan T. 02/02/2013, 9:06 AM

## 2013-02-02 NOTE — Progress Notes (Signed)
Patient ID: [name], male   DOB: [birth year], 27 y.o.   MRN: [id number] Psychoeducational Group Note  Date:  02/02/2013 Time:  1000am  Group Topic/Focus:  Making Healthy Choices:   The focus of this group is to help patients identify negative/unhealthy choices they were using prior to admission and identify positive/healthier coping strategies to replace them upon discharge.  Participation Level:  Active  Participation Quality:  Appropriate  Affect:  Appropriate  Cognitive:  Appropriate  Insight:  Supportive  Engagement in Group:  Supportive  Additional Comments:  Inventory and Psychoeducational group   Valente David 02/02/2013,10:17 AM

## 2013-02-02 NOTE — Progress Notes (Signed)
S-Pt refusing meds (Risperdal) on bid schedule-told nurse he prefers am/hs  O-Per history  A-As above  P-Order modified to assist pt with compliance

## 2013-02-03 DIAGNOSIS — F29 Unspecified psychosis not due to a substance or known physiological condition: Secondary | ICD-10-CM | POA: Diagnosis present

## 2013-02-03 MED ORDER — SILVER SULFADIAZINE 1 % EX CREA
TOPICAL_CREAM | Freq: Two times a day (BID) | CUTANEOUS | Status: DC
  Administered 2013-02-03 – 2013-02-07 (×8): via TOPICAL

## 2013-02-03 NOTE — Progress Notes (Signed)
Patient ID: [name], male   DOB: [birth year], 27 y.o.   MRN: [id number] [facility] MD Progress Note  02/03/2013 3:01 PM [name]  MRN:  [id number] Subjective:  Patient presented for 1:1 today to discuss his progress laughing loudly and inappropriately.  Objective.   Patient seen and chart reviewed. This patient is exhibiting loud laughter inappropriately and is responding to internal stimulation. He is grandiose and hyper verbal, disorganized, with no insight and poor judgement. Diagnosis:   Axis I: Schizoaffective Disorder Axis II: Deferred Axis III:  Past Medical History  Diagnosis Date  . Right clavicle fracture   . Depression   . Anxiety    Axis IV: other psychosocial or environmental problems  ADL's:  Intact  Sleep: Fair  Appetite:  Fair  Suicidal Ideation:  Plan:  still passive suicidal thoughts Intent:  No Means:  Unknown Homicidal Ideation:  Plan:  Threatened to kill his mother. Intent:  None AEB (as evidenced by):  Psychiatric Specialty Exam: Review of Systems  Constitutional: Negative.   HENT: Negative.   Respiratory: Positive for cough. Negative for hemoptysis, sputum production, shortness of breath and wheezing.   Gastrointestinal: Negative.   Genitourinary: Negative.   Musculoskeletal: Negative.   Skin:       Multiple scratches but covered with bandages.   Neurological: Negative.   Psychiatric/Behavioral: Positive for hallucinations. The patient has insomnia.        Paranoid    Blood pressure 107/73, pulse 123, temperature 99.1 F (37.3 C), temperature source Oral, resp. rate 20, height 5\' 3"  (1.6 m), weight 58.06 kg (128 lb).Body mass index is 22.68 kg/(m^2).  General Appearance: Disheveled and Fairly Groomed  Patent attorney::  Fair  Speech:  Pressured  Volume:  Increased  Mood:  Grandiose   Affect:  Constricted and Labile  Thought Process:  Disorganized  Orientation:  Full (Time, Place, and Person)  Thought Content:  Hallucinations: Auditory and  Paranoid Ideation  Suicidal Thoughts:  Yes.  without intent/plan  Homicidal Thoughts:  Yes.  without intent/plan  Memory:  Difficulty recalling things  Judgement:  Impaired  Insight:  Lacking  Psychomotor Activity:  Increased  Concentration:  Fair  Recall:  Poor  Akathisia:  No  Handed:  Right  AIMS (if indicated):     Assets:  Housing Physical Health  Sleep:  Number of Hours: 6.75   Current Medications: Current Facility-Administered Medications  Medication Dose Route Frequency Provider Last Rate Last Dose  . acetaminophen (TYLENOL) tablet 650 mg  650 mg Oral Q6H PRN Court Joy, PA-C   650 mg at 01/31/13 2227  . alum & mag hydroxide-simeth (MAALOX/MYLANTA) 200-200-20 MG/5ML suspension 30 mL  30 mL Oral Q4H PRN Earney Navy, NP      . dextromethorphan (DELSYM) 30 MG/5ML liquid 30 mg  30 mg Oral BID Court Joy, PA-C   30 mg at 02/03/13 0750  . hydrogen peroxide 3 % 10 application in sodium chloride irrigation 0.9 % 500 mL irrigation   Irrigation BID Court Joy, PA-C      . levofloxacin [facility]) tablet 500 mg  500 mg Oral Daily Court Joy, PA-C   500 mg at 02/03/13 0750  . LORazepam (ATIVAN) tablet 1 mg  1 mg Oral TID PRN Earney Navy, NP       Or  . LORazepam (ATIVAN) injection 1 mg  1 mg Intramuscular TID PRN Earney Navy, NP      . magnesium hydroxide (MILK OF MAGNESIA) suspension  30 mL  30 mL Oral Daily PRN Earney Navy, NP      . menthol-cetylpyridinium (CEPACOL) lozenge 3 mg  1 lozenge Oral PRN Earney Navy, NP      . nicotine (NICODERM CQ - dosed in mg/24 hours) patch 21 mg  21 mg Transdermal Daily Cleotis Nipper, MD   21 mg at 02/03/13 0750  . risperiDONE (RISPERDAL) tablet 1 mg  1 mg Oral [facility]   1 mg at 02/03/13 0750  . silver sulfADIAZINE (SILVADENE) 1 % cream   Topical BID Court Joy, PA-C      . traZODone (DESYREL) tablet 50 mg  50 mg Oral QHS PRN,MR X 1 Earney Navy, NP        Lab Results:  No results found for this or any previous visit (from the past 48 hour(s)).  Physical Findings: AIMS: Facial and Oral Movements Muscles of Facial Expression: None, normal Lips and Perioral Area: None, normal Jaw: None, normal Tongue: None, normal,Extremity Movements Upper (arms, wrists, hands, fingers): None, normal Lower (legs, knees, ankles, toes): None, normal, Trunk Movements Neck, shoulders, hips: None, normal, Overall Severity Severity of abnormal movements (highest score from questions above): None, normal Incapacitation due to abnormal movements: None, normal Patient's awareness of abnormal movements (rate only patient's report): No Awareness, Dental Status Current problems with teeth and/or dentures?: No Does patient usually wear dentures?: No  CIWA:  CIWA-Ar Total: 0 COWS:  COWS Total Score: 1  Treatment Plan Summary: Daily contact with patient to assess and evaluate symptoms and progress in treatment Medication management We will continue Risperdal 1 mg at bedtime, trazodone 50 mg at bedtime.  At this time patient does not have any tremors or shakes.  Plan:1. Continue crisis management and stabilization. 2. Medication management to reduce current symptoms to base line and improve patient's overall level of functioning 3. Treat health problems as indicated. 4. Develop treatment plan to decrease risk of relapse upon discharge and the need for readmission. 5. Psycho-social education regarding relapse prevention and self care. 6. Health care follow up as needed for medical problems. 7. Continue home medications where appropriate.    Medical Decision Making Problem Points:  Established problem, stable/improving (1), Review of last therapy session (1) and Review of psycho-social stressors (1) Data Points:  Review or order clinical lab tests (1) Review and summation of old records (2) Review of medication regiment & side effects (2)  I certify that inpatient services  furnished can reasonably be expected to improve the patient's condition.  Rona Ravens. Collen Vincent RPAC 3:08 PM 02/03/2013

## 2013-02-03 NOTE — Progress Notes (Signed)
Recreation Therapy Notes   Date: 06.02.2014  Time: 9:30am  Location: [address] Day[address]   Group Topic/Focus: Goal Setting   Participation Level:  Active  Participation Quality:  Appropraite  Affect:  Euthymic    Cognitive:  Oriented  Additional Comments: Activity: Bucket List ; Explanation: Patients were asked to make a list of things they can do in the next year to keep themselves well. Meditation music was played in the background to enhance therapeutic environment.   Patient actively participated in group activity.  Patient listed three goals on his bucket list, all pertained to travel. Patient shared he feels being able to achieve a dream of his will help keep him well, as well as being able to build a support system by talking about travel. Patient stated he could make up stories about the places he has visited with his therapist.   Jearl Klinefelter, LRT/CTRS  Jearl Klinefelter 02/03/2013 1:22 PM

## 2013-02-03 NOTE — Progress Notes (Addendum)
D:  Patient a bit more pleasant today, but remains a bit irritable.  Has been visible in the milieu and attending groups.  Dressing change done this morning.  Wound is without errythema, small amount of drainage noted on the old dressing.  He rates depression at 5 and hopelessness at 2 today.  His appetite is good and he is well groomed.  He denies thoughts of suicide or self harm.   Continues with productive cough. A:  Dressing changed and cleansed with normal saline and applied Silvadene cream to the wound.  All other medications given as as scheduled.   R:  Cooperating well with staff.  Present in the milieu.  Interacting well with peers.  Wounds are healing well.
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
## 2013-02-03 NOTE — Clinical Social Work Note (Signed)
  Type of Therapy: Process Group Therapy  Participation Level:  Active  Participation Quality:  Attentive  Affect:  Appropriate  Cognitive:  Oriented  Insight:  None  Engagement in Group:  Engaged  Engagement in Therapy:  None  Modes of Intervention:  Activity, Clarification, Education, Problem-solving and Support  Summary of Progress/Problems: Today's group addressed the issue of overcoming obstacles.  Patients were asked to identify their biggest obstacle post d/c that stands in the way of their on-going success, and then problem solve as to how to manage this.  [name] states his biggest obstacle is getting his GED.  When pushed, he then states it is "self-observation", because I get caught up in it and am not motivated to do anything.  When pushed further, he was unwilling to acknowledge that medication helps him in any way, or that not taking it causes him any problems.  Limited insight.       [name] B 02/03/2013   3:20 PM

## 2013-02-04 MED ORDER — RISPERIDONE 1 MG PO TABS
1.0000 mg | ORAL_TABLET | ORAL | Status: DC
  Administered 2013-02-05 – 2013-02-06 (×2): 1 mg via ORAL
  Filled 2013-02-04 (×3): qty 1

## 2013-02-04 MED ORDER — RISPERIDONE 2 MG PO TABS
2.0000 mg | ORAL_TABLET | Freq: Every day | ORAL | Status: DC
  Administered 2013-02-04 – 2013-02-05 (×2): 2 mg via ORAL
  Filled 2013-02-04 (×3): qty 1

## 2013-02-04 NOTE — Clinical Social Work Note (Signed)
[facility] LCSW Group Therapy  02/04/2013 , 5:14 PM   Type of Therapy:  Group Therapy  Participation Level:  Minimal  Participation Quality:  Distracted  Affect:  Appropriate  Cognitive:  Oriented  Insight:  Limited  Engagement in Therapy:  Limited  Modes of Intervention:  Discussion, Exploration and Socialization  Summary of Progress/Problems: Today's group focused on the term Diagnosis.  Participants were asked to define the term, and then pronounce whether it is a negative, positive or neutral term.  Dee had nothing meaningful to contribute to the conversation.  Throughout the group he was involved in minimally distracting activities such as dropping a pencil continually on the floor so it could be heard, or scribbling on a cup which could also be heard.  When ignored, he eventually stopped.  At one point he loudly announced he needed to go "potty", and walked out after promising he would return, which he did.  Limited insight.  Poor judgment.  Appears to gain no benefit from the group process.  [name] 02/04/2013 , 5:14 PM

## 2013-02-04 NOTE — Progress Notes (Signed)
Patient ID: [name], male   DOB: 02/24/1986, 27 y.o.   MRN: [id number]  D: Pt denies SI/HI/AVH. Pt is pleasant and cooperative. Pt refused to take 2 mg of Risperdal. Pt overall affect is brighter and pt appears to be happier tonight. Pt still a little argumentative, but cooperates.   A: Pt was offered support and encouragement. Pt was given scheduled medications. Pt was encourage to attend groups. Q 15 minute checks were done for safety. Convinced pt to take Risperdal. Changed Pt dressing.  R:Pt attends groups and interacts well with peers and staff. Pt is taking medication.Pt receptive to treatment and safety maintained on unit. Pt took 2 mg of Risperdal reluctantly.

## 2013-02-04 NOTE — Progress Notes (Signed)
Adult Psychoeducational Group Note  Date:  02/04/2013 Time:  11:42 PM  Group Topic/Focus:  Wrap-Up Group:   The focus of this group is to help patients review their daily goal of treatment and discuss progress on daily workbooks.  Participation Level:  Minimal  Participation Quality:  Appropriate  Affect:  Appropriate  Cognitive:  Appropriate  Insight: Limited  Engagement in Group:  Engaged  Modes of Intervention:  Support  Additional Comments:  Patient attended and participated in group tonight. He reports having a good day. He eat and colored all day. He attended groups and eat his meals. For his recovery he plans to follow up with his payee regarding his appointments.  Lita Mains [facility] 02/04/2013, 11:42 PM

## 2013-02-04 NOTE — Progress Notes (Signed)
D: Patient denies SI/HI/AVH, however pt seen laughing inappropriately as though he was responding to internal stimuli.  Patient rates hopelessness as 1,  depression as 1, and anxiety as 3.  Patient affect and mood are anxious.  Patient did attend evening group. Patient visible on the milieu. No distress noted. A: Support and encouragement offered. Scheduled medications given to pt. Q 15 min checks continued for patient safety. R: Patient receptive. Patient remains safe on the unit.

## 2013-02-04 NOTE — [facility] Group Notes (Signed)
[facility] LCSW Aftercare Discharge Planning Group Note   02/04/2013 5:10 PM  Participation Quality:  Minimal  Mood/Affect:  Irritable  Depression Rating:  denies  Anxiety Rating:  denies  Thoughts of Suicide:  No Will you contract for safety?   NA  Current AVH:  No  Plan for Discharge/Comments:  [name] stood for most of group before walking out.  He blurted out responses to questions in a loud voice.  States he is just "chillin'" and has no complaints.  Denies symptoms.  Presentation is odd, and it is difficult to determine if he is responding to internal stimuli or he just has a quirky personality.  Transportation Means:   Supports:  [name]

## 2013-02-04 NOTE — Progress Notes (Signed)
Adult Psychoeducational Group Note  Date:  02/04/2013 Time:  10:24 AM  Group Topic/Focus:  Recovery Goals:   The focus of this group is to identify appropriate goals for recovery and establish a plan to achieve them.  Participation Level:  Active  Participation Quality:  Attentive  Affect:  Excited  Cognitive:  Alert  Insight: Good  Engagement in Group:  Limited  Modes of Intervention:  Discussion, Education and Support  Additional Comments:  [name] attended group and offered some insight that was appropriate however, he did make some comments without context and when asked to explain he could not remember what he said.   Nichola Sizer 02/04/2013, 10:24 AM

## 2013-02-04 NOTE — Progress Notes (Signed)
Patient ID: [name], male   DOB: 1985-11-08, 27 y.o.   MRN: [id number] [facility] MD Progress Note  02/04/2013 11:50 AM [name]  MRN:  [id number] Subjective:  Patient presented for 1:1 today to discuss his progress. "I'm great today!" and "Who do I talk to about leaving?"  "I'm just a little stressed about stuff." Objective.   Patient seen and chart reviewed. This patient is presenting as less manic today. He is able to complete the conversation. Does not appear to be responding to internal stimulation. He is using less pressured speech today, and is not as grandiose as yesterday.  He is still circumstantial but can be redirected. States he slept good, "can't complain about the food." He is somewhat guarded in his responses, uses lots of words but doesn't reveal the source of his anxiety. Diagnosis:   Axis I: Schizoaffective Disorder Axis II: Deferred Axis III:  Past Medical History  Diagnosis Date  . Right clavicle fracture   . Depression   . Anxiety    Axis IV: other psychosocial or environmental problems  ADL's:  Intact  Sleep: good  Appetite: "can't complain  Suicidal Ideation:  denies Homicidal Ideation:  denies  AEB (as evidenced by):the patient's report of reduced symptoms, affect, participation in unit programming. Improved mood, sleep and appetite.  Psychiatric Specialty Exam: Review of Systems  Constitutional: Negative.  Negative for fever, chills, weight loss, malaise/fatigue and diaphoresis.  HENT: Negative for congestion and sore throat.   Eyes: Negative for blurred vision, double vision and photophobia.  Respiratory: Negative for cough, shortness of breath and wheezing.   Cardiovascular: Negative for chest pain, palpitations and PND.  Gastrointestinal: Negative for heartburn, nausea, vomiting, abdominal pain, diarrhea and constipation.  Musculoskeletal: Negative for myalgias, joint pain and falls.  Neurological: Negative for dizziness, tingling, tremors,  sensory change, speech change, focal weakness, seizures, loss of consciousness, weakness and headaches.  Endo/Heme/Allergies: Negative for polydipsia. Does not bruise/bleed easily.  Psychiatric/Behavioral: Positive for depression. Negative for suicidal ideas, hallucinations, memory loss and substance abuse. The patient is nervous/anxious. The patient does not have insomnia.     Blood pressure 115/79, pulse 90, temperature 98.3 F (36.8 C), temperature source Oral, resp. rate 20, height 5\' 3"  (1.6 m), weight 58.06 kg (128 lb).Body mass index is 22.68 kg/(m^2).  General Appearance: Disheveled and Fairly Groomed  Patent attorney::  Fair  Speech:  Pressured  Volume:  Increased  Mood:  Grandiose   Affect:  Constricted and Labile  Thought Process:  Disorganized  Orientation:  Full (Time, Place, and Person)  Thought Content:  Hallucinations: Auditory and Paranoid Ideation  Suicidal Thoughts:  Yes.  without intent/plan  Homicidal Thoughts:  Yes.  without intent/plan  Memory:  Difficulty recalling things  Judgement:  Impaired  Insight:  Lacking  Psychomotor Activity:  Increased  Concentration:  Fair  Recall:  Poor  Akathisia:  No  Handed:  Right  AIMS (if indicated):     Assets:  Housing Physical Health  Sleep:  Number of Hours: 5.25   Current Medications: Current Facility-Administered Medications  Medication Dose Route Frequency Provider Last Rate Last Dose  . acetaminophen (TYLENOL) tablet 650 mg  650 mg Oral Q6H PRN Court Joy, PA-C   650 mg at 01/31/13 2227  . alum & mag hydroxide-simeth (MAALOX/MYLANTA) 200-200-20 MG/5ML suspension 30 mL  30 mL Oral Q4H PRN Earney Navy, NP      . dextromethorphan (DELSYM) 30 MG/5ML liquid 30 mg  30 mg Oral BID Leonette Most  Peggye Fothergill, PA-C   30 mg at 02/03/13 0750  . hydrogen peroxide 3 % 10 application in sodium chloride irrigation 0.9 % 500 mL irrigation   Irrigation BID Court Joy, PA-C      . levofloxacin [facility]) tablet 500 mg  500 mg  Oral Daily Court Joy, PA-C   500 mg at 02/03/13 0750  . LORazepam (ATIVAN) tablet 1 mg  1 mg Oral TID PRN Earney Navy, NP       Or  . LORazepam (ATIVAN) injection 1 mg  1 mg Intramuscular TID PRN Earney Navy, NP      . magnesium hydroxide (MILK OF MAGNESIA) suspension 30 mL  30 mL Oral Daily PRN Earney Navy, NP      . menthol-cetylpyridinium (CEPACOL) lozenge 3 mg  1 lozenge Oral PRN Earney Navy, NP      . nicotine (NICODERM CQ - dosed in mg/24 hours) patch 21 mg  21 mg Transdermal Daily Cleotis Nipper, MD   21 mg at 02/03/13 0750  . risperiDONE (RISPERDAL) tablet 1 mg  1 mg Oral BH-qamhs Mojeed Akintayo   1 mg at 02/03/13 0750  . silver sulfADIAZINE (SILVADENE) 1 % cream   Topical BID Court Joy, PA-C      . traZODone (DESYREL) tablet 50 mg  50 mg Oral QHS PRN,MR X 1 Earney Navy, NP        Lab Results: No results found for this or any previous visit (from the past 48 hour(s)).  Physical Findings: AIMS: Facial and Oral Movements Muscles of Facial Expression: None, normal Lips and Perioral Area: None, normal Jaw: None, normal Tongue: None, normal,Extremity Movements Upper (arms, wrists, hands, fingers): None, normal Lower (legs, knees, ankles, toes): None, normal, Trunk Movements Neck, shoulders, hips: None, normal, Overall Severity Severity of abnormal movements (highest score from questions above): None, normal Incapacitation due to abnormal movements: None, normal Patient's awareness of abnormal movements (rate only patient's report): No Awareness, Dental Status Current problems with teeth and/or dentures?: No Does patient usually wear dentures?: No  CIWA:  CIWA-Ar Total: 0 COWS:  COWS Total Score: 1  Treatment Plan Summary: 1. Medication management Plan: 1. Continue crisis management and stabilization. 2. Medication management to reduce current symptoms to base line and improve patient's overall level of functioning 3. Treat health  problems as indicated. 4. Develop treatment plan to decrease risk of relapse upon discharge and the need for readmission. 5. Psycho-social education regarding relapse prevention and self care. 6. Health care follow up as needed for medical problems. 7. Continue home medications where appropriate. 8. Will incease Risperdal to 1mg  in AM and 2 mg at hs. 9. ELOS: 1-3 days.   Medical Decision Making Problem Points:  Established problem, stable/improving (1), Review of last therapy session (1) and Review of psycho-social stressors (1) Data Points:  Review or order clinical lab tests (1) Review and summation of old records (2) Review of medication regiment & side effects (2)  I certify that inpatient services furnished can reasonably be expected to improve the patient's condition.  Rona Ravens. Koa Zoeller RPAC 11:50 AM 02/04/2013

## 2013-02-05 NOTE — Progress Notes (Signed)
Patient ID: [name], male   DOB: 1986-05-16, 27 y.o.   MRN: [id number] [facility] MD Progress Note  02/05/2013 11:05 AM [name]  MRN:  [id number] Subjective:  "I just don't like taking medicine that makes me feel asleep."    Objective.   Patient seen and chart reviewed. [name] is less manic than the day before and continues to show improvement. He appears to be returning to baseline behaviors. Speech is more normal, voice is still loud, he is less circumstantial, more goal directed. Education is done regarding Schizoaffective disorder and bipolar with mania.   Diagnosis:   Axis I: Schizoaffective Disorder Axis II: Deferred Axis III:  Past Medical History  Diagnosis Date  . Right clavicle fracture   . Depression   . Anxiety    Axis IV: other psychosocial or environmental problems  ADL's:  Intact  Sleep: good  Appetite: "can't complain  Suicidal Ideation:  denies Homicidal Ideation:  denies  AEB (as evidenced by):the patient's report of reduced symptoms, affect, participation in unit programming. Improved mood, sleep and appetite.  Psychiatric Specialty Exam: Review of Systems  Constitutional: Negative.  Negative for fever, chills, weight loss, malaise/fatigue and diaphoresis.  HENT: Negative for congestion and sore throat.   Eyes: Negative for blurred vision, double vision and photophobia.  Respiratory: Negative for cough, shortness of breath and wheezing.   Cardiovascular: Negative for chest pain, palpitations and PND.  Gastrointestinal: Negative for heartburn, nausea, vomiting, abdominal pain, diarrhea and constipation.  Musculoskeletal: Negative for myalgias, joint pain and falls.  Neurological: Negative for dizziness, tingling, tremors, sensory change, speech change, focal weakness, seizures, loss of consciousness, weakness and headaches.  Endo/Heme/Allergies: Negative for polydipsia. Does not bruise/bleed easily.  Psychiatric/Behavioral: Positive for depression.  Negative for suicidal ideas, hallucinations, memory loss and substance abuse. The patient is nervous/anxious. The patient does not have insomnia.     Blood pressure 115/79, pulse 90, temperature 98.3 F (36.8 C), temperature source Oral, resp. rate 20, height 5\' 3"  (1.6 m), weight 58.06 kg (128 lb).Body mass index is 22.68 kg/(m^2).  General Appearance: fairly groomed  Patent attorney::  Fair  Speech:  Pressured  Volume:  Increased  Mood:  Grandiose   Affect:  Constricted and Labile  Thought Process:  Disorganized  Orientation:  Full (Time, Place, and Person)  Thought Content:  Hallucinations: Auditory and Paranoid Ideation  Suicidal Thoughts:  denies  Homicidal Thoughts:  denies  Memory:  Difficulty recalling things  Judgement:  Impaired  Insight:  Lacking  Psychomotor Activity:  Increased  Concentration:  Fair  Recall:  Poor  Akathisia:  No  Handed:  Right  AIMS (if indicated):     Assets:  Housing Physical Health  Sleep:  Number of Hours: 5   Current Medications: Current Facility-Administered Medications  Medication Dose Route Frequency Provider Last Rate Last Dose  . acetaminophen (TYLENOL) tablet 650 mg  650 mg Oral Q6H PRN Court Joy, PA-C   650 mg at 01/31/13 2227  . alum & mag hydroxide-simeth (MAALOX/MYLANTA) 200-200-20 MG/5ML suspension 30 mL  30 mL Oral Q4H PRN Earney Navy, NP      . dextromethorphan (DELSYM) 30 MG/5ML liquid 30 mg  30 mg Oral BID Court Joy, PA-C   30 mg at 02/03/13 0750  . hydrogen peroxide 3 % 10 application in sodium chloride irrigation 0.9 % 500 mL irrigation   Irrigation BID Court Joy, PA-C      . levofloxacin [facility]) tablet 500 mg  500 mg  Oral Daily Court Joy, PA-C   500 mg at 02/03/13 0750  . LORazepam (ATIVAN) tablet 1 mg  1 mg Oral TID PRN Earney Navy, NP       Or  . LORazepam (ATIVAN) injection 1 mg  1 mg Intramuscular TID PRN Earney Navy, NP      . magnesium hydroxide (MILK OF MAGNESIA) suspension  30 mL  30 mL Oral Daily PRN Earney Navy, NP      . menthol-cetylpyridinium (CEPACOL) lozenge 3 mg  1 lozenge Oral PRN Earney Navy, NP      . nicotine (NICODERM CQ - dosed in mg/24 hours) patch 21 mg  21 mg Transdermal Daily Cleotis Nipper, MD   21 mg at 02/03/13 0750  . risperiDONE (RISPERDAL) tablet 1 mg  1 mg Oral BH-qamhs Mojeed Akintayo   1 mg at 02/03/13 0750  . silver sulfADIAZINE (SILVADENE) 1 % cream   Topical BID Court Joy, PA-C      . traZODone (DESYREL) tablet 50 mg  50 mg Oral QHS PRN,MR X 1 Earney Navy, NP        Lab Results: No results found for this or any previous visit (from the past 48 hour(s)).  Physical Findings: AIMS: Facial and Oral Movements Muscles of Facial Expression: None, normal Lips and Perioral Area: None, normal Jaw: None, normal Tongue: None, normal,Extremity Movements Upper (arms, wrists, hands, fingers): None, normal Lower (legs, knees, ankles, toes): None, normal, Trunk Movements Neck, shoulders, hips: None, normal, Overall Severity Severity of abnormal movements (highest score from questions above): None, normal Incapacitation due to abnormal movements: None, normal Patient's awareness of abnormal movements (rate only patient's report): No Awareness, Dental Status Current problems with teeth and/or dentures?: No Does patient usually wear dentures?: No  CIWA:  CIWA-Ar Total: 0 COWS:  COWS Total Score: 1  Treatment Plan Summary: 1. Medication management Plan: 1. Continue crisis management and stabilization. 2. Medication management to reduce current symptoms to base line and improve patient's overall level of functioning 3. Treat health problems as indicated. 4. Develop treatment plan to decrease risk of relapse upon discharge and the need for readmission. 5. Psycho-social education regarding relapse prevention and self care. 6. Health care follow up as needed for medical problems. 7. Continue home medications where  appropriate. 8. Will change risperdal to night time dosing at hs. 3mg . 9. ELOS: 1-3 days.   Medical Decision Making Problem Points:  Established problem, stable/improving (1), Review of last therapy session (1) and Review of psycho-social stressors (1) Data Points:  Review or order clinical lab tests (1) Review and summation of old records (2) Review of medication regiment & side effects (2)  I certify that inpatient services furnished can reasonably be expected to improve the patient's condition.  Rona Ravens. Loghan Subia RPAC 11:05 AM 02/05/2013

## 2013-02-05 NOTE — Progress Notes (Signed)
D: Patient appropriate and cooperative with staff and peers. Patient's affect/mood is anxious. He reported on the self inventory sheet that his sleep is fair, appetite/ability to pay attention is good and energy level is normal. Patient rated depression and feelings of hopelessness "1". He's interacting with peers in the milieu.  A: Support and encouragement provided to patient. Scheduled medications administered per MD orders. Cleaned and changed the dressing on left forearm. Maintain Q15 minute checks for safety.  R: Patient receptive. Denies SI/HI/AVH. Patient remains safe.

## 2013-02-05 NOTE — [facility] Group Notes (Signed)
[facility] LCSW Aftercare Discharge Planning Group Note   02/05/2013 8:23 AM  Participation Quality:  Did not attend    [place]

## 2013-02-05 NOTE — Progress Notes (Signed)
The focus of this group is to help patients review their daily goal of treatment and discuss progress on daily workbooks. Pt attended the evening group session and responded to all discussion prompts from the Writer. Pt reported having a good day, the highlights of which were a "good phone conversation" Pt had with his Therapist just prior to Group and his coloring activities. Pt stated that getting a job, be it part-time or full-time, as being his goal upon feeling well enough to do so. Pt's affect was neutral, though Pt appeared disinterested in the session, drawing on paper and not making eye contact even when speaking/being spoken to.

## 2013-02-05 NOTE — Progress Notes (Signed)
D: Pt has participated in all unit activities.Denies AVH & SI & HI. Seems preoccupied @ times.A: Dsg was changed to LUE. Continues on 15 minute checks. Supported & encouraged.R: Pt safety maintained.

## 2013-02-05 NOTE — Clinical Social Work Note (Signed)
[facility] [employer] Group Therapy  02/05/2013 , 1:20 PM    Type of Therapy:  [employer] Presentation  Participation Level:  Active  Participation Quality:  Attentive  Affect:  Blunted  Cognitive:  Oriented  Insight:  Limited  Engagement in Therapy:  Engaged  Modes of Intervention:  Discussion, Education and Socialization  Summary of Progress/Problems:  [name] from [employer] came to present his recovery story and play the guitar.  De asked many questions of the presenter specific to the [facility] program.  He sounded genuinely interested.  He blurted ut several times, but was not overly disruptive.     [name] 02/05/2013 , 1:20 PM

## 2013-02-05 NOTE — Progress Notes (Signed)
Seen and agreed. Kitiara Hintze, MD 

## 2013-02-05 NOTE — Progress Notes (Signed)
Seen and agreed. Cordia Miklos, MD 

## 2013-02-05 NOTE — Progress Notes (Signed)
Recreation Therapy Notes  Date: 06.04.2014  Time: 9:30am  Location: [address] Dayroom   Group Topic/Focus: Communication   Participation Level:  Active   Participation Quality:  Appropriate  Affect:  Euthymic   Cognitive:  Oriented   Additional Comments: Activity: Community [place]; Explanation: Patients were given a sheet titles "60 Ways to Build a Community." Patients were asked to enhance this list as a group. Patients were then given a worksheet with [place]. Patients were asked to write their name in the center circle. In the first outlying circle patients were asked to list members of their support system and activities they might participate in with those members of their support system. Patient were asked to select activities off the sheet they were given or the list created by the group. In the outermost circle patients were asked to list community resources and activities from the sheet they were given and the list created by the group.   Patient actively participated in group activity. Patient listed activities on his paper, but no individuals he would like to participate with. Patietn shared he prefers to participate in activities alone. Patient stated that being alone is "what supports me."   Jearl Klinefelter, LRT/CTRS  Jearl Klinefelter 02/05/2013 3:51 PM

## 2013-02-06 MED ORDER — RISPERIDONE 3 MG PO TABS
3.0000 mg | ORAL_TABLET | Freq: Every day | ORAL | Status: DC
  Administered 2013-02-06: 3 mg via ORAL
  Filled 2013-02-06: qty 4
  Filled 2013-02-06 (×2): qty 1
  Filled 2013-02-06: qty 4

## 2013-02-06 NOTE — Progress Notes (Signed)
Patient ID: [name], male   DOB: December 27, 1985, 27 y.o.   MRN: [id number] D: Patient in room on approach. Pt mood/affect is appropriate to circumstance. Pt reports discharging tomorrow and is looking forward to going home. Pt also talked about been compliant with his medications and follow up with therapist. Pt denies SI/HI/AVH and pain. Pt attended evening wrap up group and engaged in discussion. Pt denies any needs or concerns.  Cooperative with assessment. No acute distressed noted at this time.   A: Met with pt 1:1. Medications administered as prescribed. Writer encouraged pt to discuss feelings. Pt encouraged to come to staff with any question or concerns. 15 minutes checks for safety.  R: Patient remains safe. He is complaint with medications and denies any adverse reaction. Continue current POC.

## 2013-02-06 NOTE — Progress Notes (Signed)
Patient ID: [name], male   DOB: [birth year], 27 y.o.   MRN: [id number]  D: Took over patient's care @ 2330. Patient in bed sleeping. Respiration regular and unlabored. No sign of distress noted at this time A: 15 mins checks for safety. R: Patient is asleep and safe.

## 2013-02-06 NOTE — Progress Notes (Signed)
Adult Psychoeducational Group Note  Date:  02/06/2013 Time:  2000  Group Topic/Focus:  Wrap-Up Group:   The focus of this group is to help patients review their daily goal of treatment and discuss progress on daily workbooks.  Participation Level:  Active  Participation Quality:  Appropriate  Affect:  Appropriate  Cognitive:  Appropriate  Insight: Appropriate and Good  Engagement in Group:  Engaged  Modes of Intervention:  Discussion  Additional Comments:  Pt attended wrap-up group this evening. Pt described his day as being "pretty good day". Pt stated his therapist came to visit him today, pt stated he's ready for discharge. Pt goal for tomorrow is to work towards discharge.                                                                                                                                                                             Charonda Hefter A 02/06/2013, 9:34 PM

## 2013-02-06 NOTE — [facility] Suicide Risk Assessment (Signed)
[facility] INPATIENT:  Family/Significant Other Suicide Prevention Education  Suicide Prevention Education:  Education Completed; No one has been identified by the patient as the family member/significant other with whom the patient will be residing, and identified as the person(s) who will aid the patient in the event of a mental health crisis (suicidal ideations/suicide attempt).  With written consent from the patient, the family member/significant other has been provided the following suicide prevention education, prior to the and/or following the discharge of the patient.  The suicide prevention education provided includes the following:  Suicide risk factors  Suicide prevention and interventions  National Suicide Hotline telephone number  [facility] assessment telephone number  [facility] Emergency Assistance 911  [facility] and/or Residential Mobile Crisis Unit telephone number  Request made of family/significant other to:  Remove weapons (e.g., guns, rifles, knives), all items previously/currently identified as safety concern.    Remove drugs/medications (over-the-counter, prescriptions, illicit drugs), all items previously/currently identified as a safety concern.  The family member/significant other verbalizes understanding of the suicide prevention education information provided.  The family member/significant other agrees to remove the items of safety concern listed above. The patient did not endorse SI at the time of admission, nor did the patient c/o SI during the stay here.  SPE not required.   [name] 02/06/2013, 4:39 PM

## 2013-02-06 NOTE — Progress Notes (Signed)
D: Patient pleasant and cooperative with staff and peers. Patient's affect is appropriate to circumstance and mood is anxious at times. He reported on the self inventory sheet that he slept well, appetite is good, energy level is normal and ability to pay attention is improving. Patient rated depression and feelings of hopelessness "1". Attending groups and interacting with peers in the hallway and dayroom.  A: Support and encouragement provided to patient. Administered scheduled medications per ordering MD. Monitor Q15 minute checks for safety.  R: Patient receptive. Denies SI/HI/AVH. Patient remains safe on the unit.

## 2013-02-06 NOTE — [facility] Group Notes (Signed)
[facility] Group Notes:  (Counselor/Nursing/MHT/Case Management/Adjunct)  02/06/2013 1:15PM  Type of Therapy:  Group Therapy  Participation Level:  Active  Participation Quality:  Appropriate  Affect:  Flat  Cognitive:  Oriented  Insight:  Improving  Engagement in Group:  Limited  Engagement in Therapy:  Limited  Modes of Intervention:  Discussion, Exploration and Socialization  Summary of Progress/Problems: The topic for group was balance in life.  Pt participated in the discussion about when their life was in balance and out of balance and how this feels.  Pt discussed ways to get back in balance and short term goals they can work on to get where they want to be. Dee entertained [place] with his tangential thoughts and loose associations that ranged from "feeling like I'm dying in my bed" to "exploring a wild forest" to "setting my bed on fire."  He eventually admitted that he knows he does not destroy property and is more blanced when he takes his medication, but keeps quitting it because "I don't want to have to take meds for the rest of my life."  Today he is balanced, especially since the Dr is moving his meds to QHS so that he will not be drowsy during the day.   [name] 02/06/2013 2:52 PM

## 2013-02-06 NOTE — Progress Notes (Signed)
[facility] MD Progress Note  02/06/2013 12:19 PM [name]  MRN:  [id number] Subjective: "I'm doing good. My therapist came to discuss how I'm doing." Objective: Patient is continuing to improve, showing normal speech, goal directed discussion. Feels that he is ready to return home. No new problems. Diagnosis:  Schizoaffective disorder  ADL's:  Intact  Sleep: Good  Appetite:  Good  Suicidal Ideation:  denies Homicidal Ideation:  denies AEB (as evidenced by): Patient's affect and response to direct questioning.   Psychiatric Specialty Exam: Review of Systems  Constitutional: Negative.  Negative for fever, chills, weight loss, malaise/fatigue and diaphoresis.  HENT: Negative for congestion and sore throat.   Eyes: Negative for blurred vision, double vision and photophobia.  Respiratory: Negative for cough, shortness of breath and wheezing.   Cardiovascular: Negative for chest pain, palpitations and PND.  Gastrointestinal: Negative for heartburn, nausea, vomiting, abdominal pain, diarrhea and constipation.  Musculoskeletal: Negative for myalgias, joint pain and falls.  Neurological: Negative for dizziness, tingling, tremors, sensory change, speech change, focal weakness, seizures, loss of consciousness, weakness and headaches.  Endo/Heme/Allergies: Negative for polydipsia. Does not bruise/bleed easily.  Psychiatric/Behavioral: Negative for depression, suicidal ideas, hallucinations, memory loss and substance abuse. The patient is not nervous/anxious and does not have insomnia.     Blood pressure 123/62, pulse 99, temperature 98.1 F (36.7 C), temperature source Oral, resp. rate 16, height 5\' 3"  (1.6 m), weight 58.06 kg (128 lb).Body mass index is 22.68 kg/(m^2).  General Appearance: Fairly Groomed  Patent attorney::  Good  Speech:  Clear and Coherent  Volume:  Normal  Mood:  Anxious and Depressed  Affect:  Congruent  Thought Process:  Goal Directed  Orientation:  Full (Time, Place, and  Person)  Thought Content:  Hallucinations: Auditory  Suicidal Thoughts:  No  Homicidal Thoughts:  No  Memory:  Immediate;   Fair  Judgement:  Fair  Insight:  Fair  Psychomotor Activity:  Increased  Concentration:  Fair  Recall:  Fair  Akathisia:  No  Handed:  Right  AIMS (if indicated):     Assets:  Communication Skills Desire for Improvement Housing Physical Health Resilience  Sleep:  Number of Hours: 6.25   Current Medications: Current Facility-Administered Medications  Medication Dose Route Frequency Provider Last Rate Last Dose  . acetaminophen (TYLENOL) tablet 650 mg  650 mg Oral Q6H PRN Court Joy, PA-C   650 mg at 01/31/13 2227  . alum & mag hydroxide-simeth (MAALOX/MYLANTA) 200-200-20 MG/5ML suspension 30 mL  30 mL Oral Q4H PRN Earney Navy, NP      . levofloxacin (LEVAQUIN) tablet 500 mg  500 mg Oral Daily Court Joy, PA-C   500 mg at 02/06/13 3086  . LORazepam (ATIVAN) tablet 1 mg  1 mg Oral TID PRN Earney Navy, NP       Or  . LORazepam (ATIVAN) injection 1 mg  1 mg Intramuscular TID PRN Earney Navy, NP      . magnesium hydroxide (MILK OF MAGNESIA) suspension 30 mL  30 mL Oral Daily PRN Earney Navy, NP      . menthol-cetylpyridinium (CEPACOL) lozenge 3 mg  1 lozenge Oral PRN Earney Navy, NP   3 mg at 02/05/13 1716  . nicotine (NICODERM CQ - dosed in mg/24 hours) patch 21 mg  21 mg Transdermal Daily Cleotis Nipper, MD   21 mg at 02/06/13 0629  . risperiDONE (RISPERDAL) tablet 3 mg  3 mg Oral QHS Verne Spurr,  PA-C      . silver sulfADIAZINE (SILVADENE) 1 % cream   Topical BID Verne Spurr, PA-C      . traZODone (DESYREL) tablet 50 mg  50 mg Oral QHS PRN,MR X 1 Earney Navy, NP        Lab Results: No results found for this or any previous visit (from the past 48 hour(s)).  Physical Findings: AIMS: Facial and Oral Movements Muscles of Facial Expression: None, normal Lips and Perioral Area: None, normal Jaw: None,  normal Tongue: None, normal,Extremity Movements Upper (arms, wrists, hands, fingers): None, normal Lower (legs, knees, ankles, toes): None, normal, Trunk Movements Neck, shoulders, hips: None, normal, Overall Severity Severity of abnormal movements (highest score from questions above): None, normal Incapacitation due to abnormal movements: None, normal Patient's awareness of abnormal movements (rate only patient's report): No Awareness, Dental Status Current problems with teeth and/or dentures?: No Does patient usually wear dentures?: No  CIWA:  CIWA-Ar Total: 0 COWS:  COWS Total Score: 1  Treatment Plan Summary: Daily contact with patient to assess and evaluate symptoms and progress in treatment Medication management  Plan: 1. Continue crisis management and stabilization. 2. Medication management to reduce current symptoms to base line and improve patient's overall level of functioning 3. Treat health problems as indicated. 4. Develop treatment plan to decrease risk of relapse upon discharge and the need for readmission. 5. Psycho-social education regarding relapse prevention and self care. 6. Health care follow up as needed for medical problems. 7. Continue home medications where appropriate. 8. Will move full dose of Risperdal to 3mg  at hs at patient's request. 9. Anticipate D/C home in the AM. 10. ELOS: 1 day.  Medical Decision Making Problem Points:  Established problem, stable/improving (1) and Review of psycho-social stressors (1) Data Points:  Review of medication regiment & side effects (2)  I certify that inpatient services furnished can reasonably be expected to improve the patient's condition.  Rona Ravens. Blayklee Mable RPAC 12:26 PM 02/06/2013

## 2013-02-06 NOTE — BHH Group Notes (Signed)
[facility] LCSW Aftercare Discharge Planning Group Note   02/06/2013 8:15 AM  Participation Quality:  Engaged  Mood/Affect:  Excited  Depression Rating:  denies  Anxiety Rating:  denies  Thoughts of Suicide:  No Will you contract for safety?   NA  Current AVH:  No  Plan for Discharge/Comments:  Feeling good today.  Is looking forward to a visit by his therapist.  Is still unfiltered and intrusive today, but is less so that yesterday, and is now recognizing when it happens and taking responsibility for it.  Transportation Means: bus  Supports: formal  [place], [facility]

## 2013-02-07 DIAGNOSIS — F259 Schizoaffective disorder, unspecified: Principal | ICD-10-CM

## 2013-02-07 MED ORDER — RISPERIDONE 3 MG PO TABS: 3.0000 mg | ORAL_TABLET | Freq: Every day | ORAL | Status: DC

## 2013-02-07 NOTE — Progress Notes (Signed)
Recreation Therapy Notes  Date: 06.06.2014  Time: 9:30am  Location: [address] Dayroom   Group Topic/Focus: Self Expression   Participation Level:  Active   Participation Quality:  Appropriate   Affect:  Euthymic   Cognitive:  Appropriate   Additional Comments: Activity: My Life; Explanation: Patients were given construction paper, crayons, markers and colored pencils. Patients were asked to draw or use words to represent their lives at this moment. Classical music was played to enhance therapeutic space.   Patient actively participated in group session. Patient represented his life in positive emotion words, such as spontaneous, elated, and inviting. Patient stated this represented how he wants to be. Patient stated in the past he has been a negative person, but he wants to change to be more positive.   Marykay Lex Edye Hainline, LRT/CTRS  Jearl Klinefelter 02/07/2013 12:59 PM

## 2013-02-07 NOTE — Tx Team (Signed)
  Interdisciplinary Treatment Plan Update   Date Reviewed:  02/07/2013  Time Reviewed:  9:21 AM  Progress in Treatment:   Attending groups: Yes Participating in groups: Yes Taking medication as prescribed: Yes  Tolerating medication: Yes Family/Significant other contact made: Yes  Patient understands diagnosis: Yes  Discussing patient identified problems/goals with staff: Yes Medical problems stabilized or resolved: Yes Denies suicidal/homicidal ideation: Yes  In tx team Patient has not harmed self or others: Yes  For review of initial/current patient goals, please see plan of care.  Estimated Length of Stay:  d/c today  Reason for Continuation of Hospitalization:   New Problems/Goals identified:  N/A  Discharge Plan or Barriers:   return home, follow up outpt  Additional Comments:  Attendees:  Signature: Thedore Mins, MD 02/07/2013 9:21 AM   Signature: Richelle Ito, LCSW 02/07/2013 9:21 AM  Signature: Verne Spurr, PA 02/07/2013 9:21 AM  Signature: Harold Barban, RN 02/07/2013 9:21 AM  Signature:  02/07/2013 9:21 AM  Signature:  02/07/2013 9:21 AM  Signature:   02/07/2013 9:21 AM  Signature:    Signature:    Signature:    Signature:    Signature:    Signature:      Scribe for Treatment Team:   Richelle Ito, LCSW  02/07/2013 9:21 AM

## 2013-02-07 NOTE — Progress Notes (Signed)
Seen and agreed. Kathye Cipriani, MD 

## 2013-02-07 NOTE — BHH Suicide Risk Assessment (Signed)
Suicide Risk Assessment  Discharge Assessment     Demographic Factors:  Male, Low socioeconomic status and Unemployed  Mental Status Per Nursing Assessment::   On Admission:     Current Mental Status by Physician: patient denies suicidal ideations, intent or plan  Loss Factors: Decrease in vocational status and Financial problems/change in socioeconomic status  Historical Factors: Impulsivity  Risk Reduction Factors:   Positive social support and Positive therapeutic relationship  Continued Clinical Symptoms:  Previous Psychiatric Diagnoses and Treatments  Cognitive Features That Contribute To Risk:  Closed-mindedness Polarized thinking    Suicide Risk:  Minimal: No identifiable suicidal ideation.  Patients presenting with no risk factors but with morbid ruminations; may be classified as minimal risk based on the severity of the depressive symptoms  Discharge Diagnoses:   AXIS I:  Schizoaffective Disorder AXIS II:  Deferred AXIS III:   Past Medical History  Diagnosis Date  . Right clavicle fracture   . Depression   . Anxiety    AXIS IV:  other psychosocial or environmental problems and problems related to social environment AXIS V:  61-70 mild symptoms  Plan Of Care/Follow-up recommendations:  Activity:  as tolerated Diet:  healthy Tests:  routine Other:  patient to keep his after care appointment  Is patient on multiple antipsychotic therapies at discharge:  No   Has Patient had three or more failed trials of antipsychotic monotherapy by history:  No  Recommended Plan for Multiple Antipsychotic Therapies: N/A  Gwendlyon Zumbro,MD 02/07/2013, 8:58 AM

## 2013-02-07 NOTE — Progress Notes (Signed)
Discharge Note: Discharge instructions/prescriptions/medication samples given to patient. Patient verbalized understanding of discharge instructions and prescriptions. Returned belongings to patient. Denies SI/HI/AVH. Patient d/c without incident to the lobby; two bus passes given for public transportation; informed the patient of the nearest bus stop.
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
## 2013-02-07 NOTE — Progress Notes (Signed)
[facility] Adult Case Management Discharge Plan :  Will you be returning to the same living situation after discharge: Yes,  home At discharge, do you have transportation home?:Yes,  bus Do you have the ability to pay for your medications:Yes,  MCD  Release of information consent forms completed and in the chart;  Patient's signature needed at discharge.  Patient to Follow up at: Follow-up Information   Follow up with [facility] On 02/13/2013. (2:00 with Tiffany and Ms Earlene Plater)    Contact information:   [address]  [place] [phone]      Patient denies SI/HI:   Yes,  yes    Safety Planning and Suicide Prevention discussed:  Yes,  yes  Ida Rogue 02/07/2013, 9:31 AM

## 2013-02-11 NOTE — Discharge Summary (Signed)
Seen and agreed. Bevelyn Arriola, MD 

## 2013-02-11 NOTE — Progress Notes (Signed)
Patient Discharge Instructions:  After Visit Summary (AVS):   Faxed to:  02/11/13 Discharge Summary Note:   Faxed to:  02/11/13 Psychiatric Admission Assessment Note:   Faxed to:  02/11/13 Suicide Risk Assessment - Discharge Assessment:   Faxed to:  02/11/13 Faxed/Sent to the Next Level Care provider:  02/11/13 Faxed to [facility] @ [phone]  Jerelene Redden, 02/11/2013, 3:58 PM
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
## 2016-06-23 ENCOUNTER — Emergency Department
Admission: EM | Admit: 2016-06-23 | Discharge: 2016-06-23 | Disposition: A | Discharge status: Home / Self Care | Payer: Medicaid Other | Attending: Emergency Medicine | Admitting: Emergency Medicine

## 2016-06-23 DIAGNOSIS — F250 Schizoaffective disorder, bipolar type: Secondary | ICD-10-CM | POA: Insufficient documentation

## 2016-06-23 DIAGNOSIS — Z79899 Other long term (current) drug therapy: Secondary | ICD-10-CM | POA: Insufficient documentation

## 2016-06-23 DIAGNOSIS — M79672 Pain in left foot: Secondary | ICD-10-CM

## 2016-06-23 DIAGNOSIS — F1290 Cannabis use, unspecified, uncomplicated: Secondary | ICD-10-CM | POA: Insufficient documentation

## 2016-06-23 DIAGNOSIS — F17210 Nicotine dependence, cigarettes, uncomplicated: Secondary | ICD-10-CM | POA: Insufficient documentation

## 2016-06-23 LAB — CBC WITH DIFFERENTIAL/PLATELET
BASOS ABS: 0.1 10*3/uL (ref 0–0.1)
Basophils Relative: 1 %
EOS PCT: 3 %
Eosinophils Absolute: 0.2 10*3/uL (ref 0–0.7)
HCT: 43.1 % (ref 40.0–52.0)
Hemoglobin: 14.8 g/dL (ref 13.0–18.0)
LYMPHS PCT: 36 %
Lymphs Abs: 3.1 10*3/uL (ref 1.0–3.6)
MCH: 29.3 pg (ref 26.0–34.0)
MCHC: 34.4 g/dL (ref 32.0–36.0)
MCV: 85.4 fL (ref 80.0–100.0)
MONO ABS: 0.9 10*3/uL (ref 0.2–1.0)
Monocytes Relative: 11 %
Neutro Abs: 4.2 10*3/uL (ref 1.4–6.5)
Neutrophils Relative %: 49 %
PLATELETS: 220 10*3/uL (ref 150–440)
RBC: 5.05 MIL/uL (ref 4.40–5.90)
RDW: 13.6 % (ref 11.5–14.5)
WBC: 8.4 10*3/uL (ref 3.8–10.6)

## 2016-06-23 LAB — COMPREHENSIVE METABOLIC PANEL
ALT: 18 U/L (ref 17–63)
ANION GAP: 7 (ref 5–15)
AST: 28 U/L (ref 15–41)
Albumin: 5.1 g/dL — ABNORMAL HIGH (ref 3.5–5.0)
Alkaline Phosphatase: 65 U/L (ref 38–126)
BUN: 8 mg/dL (ref 6–20)
CHLORIDE: 106 mmol/L (ref 101–111)
CO2: 26 mmol/L (ref 22–32)
Calcium: 9.9 mg/dL (ref 8.9–10.3)
Creatinine, Ser: 0.99 mg/dL (ref 0.61–1.24)
Glucose, Bld: 128 mg/dL — ABNORMAL HIGH (ref 65–99)
POTASSIUM: 3.2 mmol/L — AB (ref 3.5–5.1)
Sodium: 139 mmol/L (ref 135–145)
Total Bilirubin: 1.8 mg/dL — ABNORMAL HIGH (ref 0.3–1.2)
Total Protein: 7.8 g/dL (ref 6.5–8.1)

## 2016-06-23 LAB — SALICYLATE LEVEL

## 2016-06-23 LAB — ETHANOL

## 2016-06-23 LAB — ACETAMINOPHEN LEVEL

## 2016-06-23 MED ORDER — IBUPROFEN 800 MG PO TABS
800.0000 mg | ORAL_TABLET | Freq: Once | ORAL | Status: AC
  Administered 2016-06-23: 800 mg via ORAL
  Filled 2016-06-23: qty 1

## 2016-06-23 NOTE — ED Notes (Signed)
Pt came to RN desk agitated. Pointed to ingredient on body wash bottle and stated: "what is that". RN informed patient she is unaware of what that particular ingredient is used for. Pt stated: "well why are you even here then?". RN informed pt that it is RN's responsibility to know ingredients/uses of medications being administered, not ingredients on soaps. Pt stated: "well fine then". Pt sat down temporarily, then got up demanded to know where the bathroom was. Pt slammed door to bathroom entering/leaving bathroom. MD Dolores FrameSung informed.

## 2016-06-23 NOTE — ED Notes (Signed)
Patient given apple juice

## 2016-06-23 NOTE — BH Assessment (Signed)
Assessment Note  [name] is an 30 y.o. male presenting to the ED initially for foot pain.  Patient was reportedly picked up by EMS stating that he was walking from [facility] to [facility].  Patient could not answer why he was walking to [facility].  Patient initially states he was walking from [facility] but then stated that he was not.   Patient was guarded during the assessment and would not completely answer the question.  He states, "unless you can help me in the next 19 days, there is nothing you can do for me and there is no reason for me to be here".  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    Patient would not answer question about  drug/alcohol use.  He reports no SI/HI.  Patient appears to be responding to an internal stimuli.    Diagnosis: Paranoid  Past Medical History:  Past Medical History:  Diagnosis Date  . Anxiety   . Depression   . Right clavicle fracture     No past surgical history on file.  Family History: No family history on file.  Social History:  reports that he has been smoking Cigarettes.  He has been smoking about 0.50 packs per day. He does not have any smokeless tobacco history on file. He reports that he drinks alcohol. He reports that he uses drugs, including Marijuana.  Additional Social History:  Alcohol / Drug Use History of alcohol / drug use?: No history of alcohol / drug abuse (Patient would not answer)  CIWA: CIWA-Ar BP: (!) 139/97 Pulse Rate: (!) 54 COWS:    Allergies:  Allergies  Allergen Reactions  . Tomato Other (See Comments)    Dizziness    Home Medications:  (Not in a hospital admission)  OB/GYN Status:  No LMP for male patient.  General Assessment Data Location of Assessment: [facility] ED TTS Assessment: In system Is this a Tele or Face-to-Face Assessment?: Face-to-Face Is this an Initial Assessment or a Re-assessment for this encounter?: Initial Assessment Marital  status: Single Maiden name: n/a Is patient pregnant?: No Pregnancy Status: No Living Arrangements: Other (Comment) (Patient reports he is homeless) Can pt return to current living arrangement?: Yes Admission Status: Voluntary Is patient capable of signing voluntary admission?: Yes Referral Source: Self/Family/Friend Insurance type: [facility] Medicaid  Medical Screening Exam [facility] Walk-in ONLY) Medical Exam completed: Yes  Crisis Care Plan Living Arrangements: Other (Comment) (Patient reports he is homeless) Legal Guardian: Other: (self) Name of Psychiatrist: unknown Name of Therapist: unknown  Education Status Is patient currently in school?: No Current Grade: n/a Highest grade of school patient has completed: unknown Name of school: unknown Contact person: n/a  Risk to self with the past 6 months Suicidal Ideation: No Has patient been a risk to self within the past 6 months prior to admission? : No Suicidal Intent: No Has patient had any suicidal intent within the past 6 months prior to admission? : No Is patient at risk for suicide?: No Suicidal Plan?: No Has patient had any suicidal plan within the past 6 months prior to admission? : No Access to Means: No What has been your use of drugs/alcohol within the last 12 months?: Patient refused to answer Previous Attempts/Gestures: No How many times?: 0 Other Self Harm Risks: None identified Triggers for Past Attempts: None known Intentional Self Injurious Behavior: None Family Suicide History: Unknown Recent stressful life event(s): Other (Comment) Persecutory voices/beliefs?: Yes Depression:  No Substance abuse history and/or treatment for substance abuse?: No Suicide prevention information given to non-admitted patients: Not applicable  Risk to Others within the past 6 months Homicidal Ideation: No Does patient have any lifetime risk of violence toward others beyond the six months prior to admission? : No Thoughts of  Harm to Others: No Current Homicidal Intent: No Current Homicidal Plan: No Access to Homicidal Means: No Identified Victim: None identified History of harm to others?: No Assessment of Violence: None Noted Violent Behavior Description: None identified Does patient have access to weapons?: No Criminal Charges Pending?: No Does patient have a court date: No Is patient on probation?: Unknown  Psychosis Hallucinations: None noted Delusions: None noted  Mental Status Report Appearance/Hygiene: Unremarkable Eye Contact: Good Motor Activity: Freedom of movement Speech: Incoherent Level of Consciousness: Irritable Mood: Apprehensive, Suspicious, Irritable Affect: Inconsistent with thought content, Apprehensive Anxiety Level: Minimal Thought Processes: Flight of Ideas Judgement: Partial Orientation: Person, Situation Obsessive Compulsive Thoughts/Behaviors: None  Cognitive Functioning Concentration: Fair Memory: Recent Intact, Remote Intact IQ: Average Insight: Fair Impulse Control: Fair Appetite: Fair Sleep: Unable to Assess Vegetative Symptoms: None  ADLScreening [facility]) Patient's cognitive ability adequate to safely complete daily activities?: Yes Patient able to express need for assistance with ADLs?: Yes Independently performs ADLs?: Yes (appropriate for developmental age)  Prior Inpatient Therapy Prior Inpatient Therapy: Yes Prior Therapy Dates: 2014 Prior Therapy Facilty[facility]Provider[facility] Reason for Treatment: schizophrenia  Prior Outpatient Therapy Prior Outpatient Therapy: No Prior Therapy Dates: n/a Prior Therapy Facilty/Provider(s): n/a Reason for Treatment: n/a Does patient have an ACCT team?: Unknown Does patient have Intensive In-House Services?  : No Does patient have [facility] services? : Unknown Does patient have P4CC services?: Unknown  ADL Screening (condition at time of admission) Patient's cognitive ability adequate to safely  complete daily activities?: Yes Patient able to express need for assistance with ADLs?: Yes Independently performs ADLs?: Yes (appropriate for developmental age)       Abuse/Neglect Assessment (Assessment to be complete while patient is alone) Physical Abuse: Denies Verbal Abuse: Denies Sexual Abuse: Denies Exploitation of patient/patient's resources: Denies Self-Neglect: Denies Values / Beliefs Cultural Requests During Hospitalization: None Spiritual Requests During Hospitalization: None Consults Spiritual Care Consult Needed: No Social Work Consult Needed: No Merchant navy officerAdvance Directives (For Healthcare) Does patient have an advance directive?: No Would patient like information on creating an advanced directive?: No - patient declined information    Additional Information 1:1 In Past 12 Months?: No CIRT Risk: No Elopement Risk: Yes Does patient have medical clearance?: Yes     Disposition:  Disposition Initial Assessment Completed for this Encounter: Yes Disposition of Patient: Other dispositions (Pending Psych MD consult)  On Site Evaluation by:   Reviewed with Physician:    Artist Beachoxana C Bani Gianfrancesco 06/23/2016 5:51 AM

## 2016-06-23 NOTE — ED Notes (Signed)
Pt reports being homeless. Pt came in today for foot pain related to [place] [facility] [place] [place] [facility]. MD ordered to delay discharge in order to monitor patient due to history of psych issues. Patient currently paranoid and irritable. Pt laughing at inappropriate times.  Pt refusing MD's offer to speak to TTS at this time. Pt denies suicidal/homicidal ideation. RN will continue to monitor.

## 2016-06-23 NOTE — ED Triage Notes (Signed)
Pt brought in by EMS ambulatory with no distress noted; st homeless and walking from [place] to [place]; c/o pain to feet & legs from walking

## 2016-06-23 NOTE — ED Provider Notes (Signed)
Mr. [name] was quite frustrated that he had not seen the social worker yet this morning, and was stating he wanted to go on and keep walking, but he was redirectable and the social worker was able to come and talk with him about resources, specifically boarding houses as he had stated he wanted to live here.  For me he stated that he was going [place] [facility] [place] and was guarded about what is next plans were, but stated that he had a payer source SSI and he was gone have access to that in a few days.  He was not exactly forthcoming with exact plans, but stated he no longer wanted any additional help with regard to seeing a psychiatrist, or help being placed (he is reporting that he is homeless).  Although guarded, patient did not appear to be interacting with internal stimuli during my interactions with him, nor violent, nor paranoid with me. He was very focused on obtaining his finances here in a few days, and continuing to walk towards [facility] which was his apparent initial destination.  He denies depression, suicidal or homicidal thoughts/plans, and I don't feel he meets criteria for IVC, holding against his will.  This is the same impression over time as the earlier physician, Dr. Dolores FrameSung, as well as the TTS and nurses and social worker who interacted with him.  He did not want to take his discharge paperwork with him, but we did discuss them verbally and he said thank you and was walked out to waiting room by Child psychotherapistsocial worker.   Governor Rooksebecca Torez Beauregard, MD 06/23/16 [phone]

## 2016-06-23 NOTE — ED Notes (Signed)
Pt. States he likes to be called by his middle name "Demario".

## 2016-06-23 NOTE — Clinical Social Work Note (Signed)
Clinical Social Work Assessment  Patient Details  Name: [name] MRN: [id number] Date of Birth: 08/12/1986  Date of referral:  06/23/16               Reason for consult:  [employer], Discharge Planning, Medication Concerns, Other (Comment Required) (Homeless)                Permission sought to share information with:    Permission granted to share information::  No (Patient refused any contact)  Name::        Agency::     Relationship::     Contact Information:     Housing/Transportation Living arrangements for the past 2 months:  Homeless Source of Information:  Patient Patient Interpreter Needed:  None Criminal Activity/Legal Involvement Pertinent to Current Situation/Hospitalization:  No - Comment as needed Significant Relationships:  None Lives with:  Self Do you feel safe going back to the place where you live?  No Need for family participation in patient care:  No (Coment)  Care giving concerns:  Unable to assess patient refused LCSW to contact any family members/therapist/payee   Social Worker assessment / plan:  LCSW met with patient who just wanted to leave and refused this worker to contact any supports with family or payee or therapist. He was very aggressive verbally and agitated, after a while of engagement LCSW was able to ask questions to see what this patient needed. LCSW discovered he has been in foster care and aged out. He received SSI and has medicaid. He did not want to go to resy home, shelter, or accept any other resources this worker provided. He reported he is not suicidal or homicidal and needs nothing but to leave. He did not want to return to [facility]  Or [place].  Employment status:   (ssi) Insurance information:  Medicaid In [place] PT Recommendations:   na Information / Referral to community resources:  Shelter  Patient/Family's Response to care: Unable to contact  Patient/Family's Understanding of and Emotional  Response to Diagnosis, Current Treatment, and Prognosis:  Unable to access  Emotional Assessment Appearance:  Appears stated age Attitude/Demeanor/Rapport:  Aggressive (Verbally and/or physically), Angry, Apprehensive, Guarded, Hostile Affect (typically observed):  Overwhelmed, Defensive Orientation:  Oriented to Self, Oriented to Place, Oriented to  Time, Oriented to Situation Alcohol / Substance use:  Never Used Psych involvement (Current and /or in the community):   (Yes but wrongfully diagnosed with schizoaffective disorder according to patient)  Discharge Needs  Concerns to be addressed:   None Readmission within the last 30 days:  No Current discharge risk:  Homeless Barriers to Discharge:  No Barriers Identified   Joana Reamer, LCSW 06/23/2016, 8:57 AM

## 2016-06-23 NOTE — ED Notes (Signed)
Patient given drink and graham crackers

## 2016-06-23 NOTE — Discharge Instructions (Signed)
You may take Tylenol and/or ibuprofen as needed for pain.  Return to the ER for worsening symptoms, persistent vomiting, difficulty breathing or other concerns. 

## 2016-06-23 NOTE — ED Notes (Signed)
Patient given meal tray.

## 2016-06-23 NOTE — ED Notes (Signed)
Pt. States he currently has no place to live, pt. States he would like to stay in [place].

## 2016-06-23 NOTE — Progress Notes (Signed)
LCSW met with patient and spoke to him of several resources available to him. He has family and Payee and has access to 300.00 discussed rooming housing in [place] and provided several other options. He chose to leave. I have collected enough information in case patient returns. Patient reports he is not suicidal or homicidal and is not hearing or seeing things. In consultation with officer and EDP and EDN they all made several attempts to engage and he was discharged as he only came in for foot pain and treated. Patient declined bus tickets and map. Patient left via the lobby and explained he is welcome back if he was unwell.  No further needs   [employer] LCSW [phone]

## 2016-06-23 NOTE — ED Notes (Signed)
Pt c/o left foot pain with dorsi/plantarflexion. Pt denies injury to area. Pt reports pain began while walking yesterday and continued throughout the day. Pt is able to tolerate weight on foot. Pt ambulates with steady gait

## 2016-06-23 NOTE — ED Provider Notes (Signed)
[facility] Emergency Department Provider Note   ____________________________________________   First MD Initiated Contact with Patient 06/23/16 [phone]     (approximate)  I have reviewed the triage vital signs and the nursing notes.   HISTORY  Chief Complaint Foot Pain    HPI [name] is a 30 y.o. male who presents to the ED from the street with a chief complaint of left foot pain. Patient has a history of bipolar disorder and schizophrenia whose is homeless and states he has been walking from [place]. Complains of nontraumatic pain to his left foot. Denies extremity weakness, numbness/tingling. Denies recent fever, chills, chest pain, shortness of breath, abdominal pain, nausea, vomiting, diarrhea. Denies SI/HI/AH/VH. Nothing makes his pain better. Movement and ambulation makes his pain worse.   Past Medical History:  Diagnosis Date  . Anxiety   . Depression   . Right clavicle fracture     Patient Active Problem List   Diagnosis Date Noted  . Psychosis 02/03/2013    Class: Chronic    No past surgical history on file.  Prior to Admission medications   Medication Sig Start Date End Date Taking? Authorizing Provider  risperiDONE (RISPERDAL) 3 MG tablet Take 1 tablet (3 mg total) by mouth at bedtime. For mood stabilization. 02/07/13   Tamala JulianNeil T Mashburn, PA-C    Allergies Tomato  No family history on file.  Social History Social History  Substance Use Topics  . Smoking status: Current Every Day Smoker    Packs/day: 0.50    Types: Cigarettes  . Smokeless tobacco: Not on file  . Alcohol use Yes     Comment: occasionally    Review of Systems  Constitutional: No fever/chills. Eyes: No visual changes. ENT: No sore throat. Cardiovascular: Denies chest pain. Respiratory: Denies shortness of breath. Gastrointestinal: No abdominal pain.  No nausea, no vomiting.  No diarrhea.  No  constipation. Genitourinary: Negative for dysuria. Musculoskeletal: Positive for left foot pain. Negative for back pain. Skin: Negative for rash. Neurological: Negative for headaches, focal weakness or numbness.  10-point ROS otherwise negative.  ____________________________________________   PHYSICAL EXAM:  VITAL SIGNS: ED Triage Vitals  Enc Vitals Group     BP 06/23/16 0112 (!) 139/97     Pulse Rate 06/23/16 0112 (!) 54     Resp 06/23/16 0112 18     Temp 06/23/16 0112 97.2 F (36.2 C)     Temp Source 06/23/16 0112 Oral     SpO2 06/23/16 0112 99 %     Weight 06/23/16 0113 140 lb (63.5 kg)     Height 06/23/16 0113 5\' 3"  (1.6 m)     Head Circumference --      Peak Flow --      Pain Score 06/23/16 0113 7     Pain Loc --      Pain Edu? --      Excl. in GC? --     Constitutional: Alert and oriented. Well appearing and in no acute distress. Eyes: Conjunctivae are normal. PERRL. EOMI. Head: Atraumatic. Nose: No congestion/rhinnorhea. Mouth/Throat: Mucous membranes are moist.  Oropharynx non-erythematous. Neck: No stridor.   Cardiovascular: Normal rate, regular rhythm. Grossly normal heart sounds.  Good peripheral circulation. Respiratory: Normal respiratory effort.  No retractions. Lungs CTAB. Gastrointestinal: Soft and nontender. No distention. No abdominal bruits. No CVA tenderness. Musculoskeletal:  Left foot: No external evidence of injury. No deformity. Mild tenderness to palpation to anterior talofibular  ligament. Full range of motion with slight pain. 2+ distal pulses. Symmetrically warm limb without evidence for ischemia. Neurologic:  Normal speech and language. No gross focal neurologic deficits are appreciated. No gait instability. Skin:  Skin is warm, dry and intact. No rash noted. Psychiatric: Mood and affect are paranoid. Speech and behavior are normal.  ____________________________________________   LABS (all labs ordered are listed, but only abnormal  results are displayed)  Labs Reviewed  COMPREHENSIVE METABOLIC PANEL - Abnormal; Notable for the following:       Result Value   Potassium 3.2 (*)    Glucose, Bld 128 (*)    Albumin 5.1 (*)    Total Bilirubin 1.8 (*)    All other components within normal limits  ACETAMINOPHEN LEVEL - Abnormal; Notable for the following:    Acetaminophen (Tylenol), Serum <10 (*)    All other components within normal limits  CBC WITH DIFFERENTIAL/PLATELET  ETHANOL  SALICYLATE LEVEL  URINE DRUG SCREEN, QUALITATIVE ([facility] ONLY)   ____________________________________________  EKG  None ____________________________________________  RADIOLOGY  None ____________________________________________   PROCEDURES  Procedure(s) performed: None  Procedures  Critical Care performed: No  ____________________________________________   INITIAL IMPRESSION / ASSESSMENT AND PLAN / ED COURSE  Pertinent labs & imaging results that were available during my care of the patient were reviewed by me and considered in my medical decision making (see chart for details).  30 year old male who presents with left foot pain secondary to overuse from walking from [place]. Motrin administered. Patient appears paranoid, but not suicidal or homicidal and declines my offer for TTS evaluation. He will remain in the ED until morning since he is walking.  Clinical Course  Comment By Time  Patient observed to laugh inappropriately at times. Seems to be responding to internal stimuli. Reading ingredients of a body wash which is in his bag; threw it down the sink and slammed the door. Appears irritable. Will ask TTS to consult. Irean Hong, MD 10/20 [phone]  TTS evaluated patient and recommended psychiatric consultation this morning. Patient remains irritable at times but is easily redirected. Does not meet IVC criteria. He acquiesced to blood draw. Requesting group home placement in [place]. Will consult clinical social work.  At this time, patient remains in the emergency department under involuntary status pending psychiatry and clinical social work evaluation this morning. Irean Hong, MD 10/20 0636     ____________________________________________   FINAL CLINICAL IMPRESSION(S) / ED DIAGNOSES  Final diagnoses:  Foot pain, left  Schizoaffective disorder, bipolar type (HCC)      NEW MEDICATIONS STARTED DURING THIS VISIT:  New Prescriptions   No medications on file     Note:  This document was prepared using Dragon voice recognition software and may include unintentional dictation errors.    Irean Hong, MD 06/23/16 [phone]

## 2016-06-27 ENCOUNTER — Emergency Department
Admission: EM | Admit: 2016-06-27 | Discharge: 2016-06-27 | Disposition: A | Discharge status: Home / Self Care | Payer: Medicaid Other | Attending: Emergency Medicine | Admitting: Emergency Medicine

## 2016-06-27 DIAGNOSIS — M79671 Pain in right foot: Secondary | ICD-10-CM | POA: Insufficient documentation

## 2016-06-27 DIAGNOSIS — M79672 Pain in left foot: Secondary | ICD-10-CM | POA: Diagnosis not present

## 2016-06-27 DIAGNOSIS — F17210 Nicotine dependence, cigarettes, uncomplicated: Secondary | ICD-10-CM | POA: Insufficient documentation

## 2016-06-27 DIAGNOSIS — Z5321 Procedure and treatment not carried out due to patient leaving prior to being seen by health care provider: Secondary | ICD-10-CM | POA: Diagnosis not present

## 2016-06-27 DIAGNOSIS — Z046 Encounter for general psychiatric examination, requested by authority: Secondary | ICD-10-CM | POA: Diagnosis present

## 2016-06-27 NOTE — ED Provider Notes (Signed)
Addendum to 06/23/2016 note: Note timed 0636 should read "patient remains in the emergency department under voluntary status"   Irean HongJade J Sung, MD 06/27/16 [phone]

## 2016-06-27 NOTE — ED Notes (Signed)
Pt presents to ED with c/o left foot pain, pt refusing to take off shoe to show MD and this RN foot, MD in room informing patient we must take the shoe off to assess foot. Pt then states "I haven't taken a shower in a long time." Informed pt it was ok and asked patient to take show off again. Pt agreed to remove shoe, pt reports is homeless and is always on his feet, calluses noted to bottom of foot. Pt offered a shower by ED staff but patient refused. Meal tray, cola, and warm blanket provided to patient per MD order. Pt was recently seen here for same symptoms.

## 2016-06-27 NOTE — ED Triage Notes (Signed)
Pt to ED via [employer] after being found by fire department twirling umbrella in the street.  Pt states "I was looking for a cigarette to see if there was tobacco in it".  Pt denies medical or psychiatric hx.

## 2016-06-27 NOTE — ED Triage Notes (Signed)
Patient ambulatory to triage with steady gait, without difficulty or distress noted; pt seen recently for foot pain and is apparently homeless; pt returns tonight for same, stating his left foot hurts but "not gonna take my shoe off"

## 2016-06-27 NOTE — ED Notes (Signed)
Pt brought in voluntary.  During triage patient was asked to dress out into hospital scrubs and reasoning.  Patient uncoopperative and stated "you didn't do this to me before, I ain't staying".  Patient took belongings and walked out during triage process.

## 2016-06-27 NOTE — ED Provider Notes (Signed)
[facility] Emergency Department Provider Note   First MD Initiated Contact with Patient 06/27/16 0518     (approximate)  I have reviewed the triage vital signs and the nursing notes.   HISTORY  Chief Complaint Foot Pain    HPI [name] is a 30 y.o. male with history of bipolar disorder and schizophrenia who is currently homeless presents to the emergency department with bilateral feet pain. Patient states that his foot hurt never he walks. Patient denies any trauma to the feet. Of note the patient was seen on 06/23/2016 with the same complaint. Patient initially refusing to remove his shoes I can evaluate his feet however became agreeable. Patient denies any suicidal or homicidal ideation.   Past Medical History:  Diagnosis Date  . Anxiety   . Depression   . Right clavicle fracture     Patient Active Problem List   Diagnosis Date Noted  . Psychosis 02/03/2013    Class: Chronic    No past surgical history on file.  Prior to Admission medications   Medication Sig Start Date End Date Taking? Authorizing Provider  risperiDONE (RISPERDAL) 3 MG tablet Take 1 tablet (3 mg total) by mouth at bedtime. For mood stabilization. 02/07/13   Tamala JulianNeil T Mashburn, PA-C    Allergies Tomato  No family history on file.  Social History Social History  Substance Use Topics  . Smoking status: Current Every Day Smoker    Packs/day: 0.50    Types: Cigarettes  . Smokeless tobacco: Not on file  . Alcohol use Yes     Comment: occasionally    Review of Systems Constitutional: No fever/chills Eyes: No visual changes. ENT: No sore throat. Cardiovascular: Denies chest pain. Respiratory: Denies shortness of breath. Gastrointestinal: No abdominal pain.  No nausea, no vomiting.  No diarrhea.  No constipation. Genitourinary: Negative for dysuria. Musculoskeletal: Negative for back pain.Positive for bilateral feet pain Skin: Negative for rash. Neurological:  Negative for headaches, focal weakness or numbness.  10-point ROS otherwise negative.  ____________________________________________   PHYSICAL EXAM:  VITAL SIGNS: ED Triage Vitals  Enc Vitals Group     BP 06/27/16 0434 (!) 139/100     Pulse Rate 06/27/16 0434 89     Resp 06/27/16 0434 18     Temp 06/27/16 0434 97.5 F (36.4 C)     Temp Source 06/27/16 0434 Oral     SpO2 06/27/16 0434 98 %     Weight 06/27/16 0430 140 lb (63.5 kg)     Height 06/27/16 0430 5\' 3"  (1.6 m)     Head Circumference --      Peak Flow --      Pain Score 06/27/16 0429 8     Pain Loc --      Pain Edu? --      Excl. in GC? --     Constitutional: Alert and oriented. Well appearing and in no acute distress. Eyes: Conjunctivae are normal. PERRL. EOMI. Head: Atraumatic. Mouth/Throat: Mucous membranes are moist.  Oropharynx non-erythematous. Cardiovascular: Normal rate, regular rhythm. Good peripheral circulation. Grossly normal heart sounds. Respiratory: Normal respiratory effort.  No retractions. Lungs CTAB. Gastrointestinal: Soft and nontender. No distention.  Musculoskeletal: No lower extremity tenderness nor edema. No gross deformities of extremities.Pain with dorsal palpation bilateral feet Neurologic:  Normal speech and language. No gross focal neurologic deficits are appreciated.  Skin:  Skin is warm, dry and intact. No rash noted. Psychiatric: Mood and affect are normal. Speech and behavior are  normal.      Procedures     INITIAL IMPRESSION / ASSESSMENT AND PLAN / ED COURSE  Pertinent labs & imaging results that were available during my care of the patient were reviewed by me and considered in my medical decision making (see chart for details).  Patient requesting to "take a nap" in the emergency department.    Clinical Course    ____________________________________________  FINAL CLINICAL IMPRESSION(S) / ED DIAGNOSES  Final diagnoses:  Foot pain, left  Foot pain, right      MEDICATIONS GIVEN DURING THIS VISIT:  Medications - No data to display   NEW OUTPATIENT MEDICATIONS STARTED DURING THIS VISIT:  New Prescriptions   No medications on file    Modified Medications   No medications on file    Discontinued Medications   No medications on file     Note:  This document was prepared using Dragon voice recognition software and may include unintentional dictation errors.    Darci Current, MD 06/27/16 [phone]

## 2016-07-17 ENCOUNTER — Encounter (HOSPITAL_COMMUNITY): Payer: Self-pay

## 2016-07-17 ENCOUNTER — Emergency Department (HOSPITAL_COMMUNITY)
Admission: EM | Admit: 2016-07-17 | Discharge: 2016-07-18 | Disposition: A | Discharge status: Other Acute Inpatient Hospital | Payer: Medicaid Other | Attending: Emergency Medicine | Admitting: Emergency Medicine

## 2016-07-17 DIAGNOSIS — Z79899 Other long term (current) drug therapy: Secondary | ICD-10-CM | POA: Diagnosis not present

## 2016-07-17 DIAGNOSIS — F17210 Nicotine dependence, cigarettes, uncomplicated: Secondary | ICD-10-CM | POA: Diagnosis not present

## 2016-07-17 DIAGNOSIS — Z5900 Homelessness unspecified: Secondary | ICD-10-CM

## 2016-07-17 DIAGNOSIS — F329 Major depressive disorder, single episode, unspecified: Secondary | ICD-10-CM | POA: Insufficient documentation

## 2016-07-17 DIAGNOSIS — R45851 Suicidal ideations: Secondary | ICD-10-CM | POA: Diagnosis not present

## 2016-07-17 LAB — ETHANOL

## 2016-07-17 LAB — SALICYLATE LEVEL

## 2016-07-17 LAB — COMPREHENSIVE METABOLIC PANEL
ALBUMIN: 4.1 g/dL (ref 3.5–5.0)
ALT: 19 U/L (ref 17–63)
ANION GAP: 10 (ref 5–15)
AST: 27 U/L (ref 15–41)
Alkaline Phosphatase: 57 U/L (ref 38–126)
BILIRUBIN TOTAL: 0.8 mg/dL (ref 0.3–1.2)
BUN: 7 mg/dL (ref 6–20)
CO2: 22 mmol/L (ref 22–32)
Calcium: 9.5 mg/dL (ref 8.9–10.3)
Chloride: 108 mmol/L (ref 101–111)
Creatinine, Ser: 0.78 mg/dL (ref 0.61–1.24)
GFR calc Af Amer: >60 mL/min (ref >60)
GFR calc non Af Amer: >60 mL/min (ref >60)
GLUCOSE: 89 mg/dL (ref 65–99)
POTASSIUM: 3.5 mmol/L (ref 3.5–5.1)
Sodium: 140 mmol/L (ref 135–145)
TOTAL PROTEIN: 6.5 g/dL (ref 6.5–8.1)

## 2016-07-17 LAB — RAPID URINE DRUG SCREEN, HOSP PERFORMED
Amphetamines: NOT DETECTED
BENZODIAZEPINES: NOT DETECTED
Barbiturates: NOT DETECTED
COCAINE: NOT DETECTED
OPIATES: NOT DETECTED
Tetrahydrocannabinol: NOT DETECTED

## 2016-07-17 LAB — CBC
HEMATOCRIT: 38.1 % — AB (ref 39.0–52.0)
Hemoglobin: 12.5 g/dL — ABNORMAL LOW (ref 13.0–17.0)
MCH: 28.2 pg (ref 26.0–34.0)
MCHC: 32.8 g/dL (ref 30.0–36.0)
MCV: 85.8 fL (ref 78.0–100.0)
PLATELETS: 289 10*3/uL (ref 150–400)
RBC: 4.44 MIL/uL (ref 4.22–5.81)
RDW: 12.7 % (ref 11.5–15.5)
WBC: 8.8 10*3/uL (ref 4.0–10.5)

## 2016-07-17 LAB — ACETAMINOPHEN LEVEL

## 2016-07-17 MED ORDER — LORAZEPAM 1 MG PO TABS: 1.0000 mg | ORAL_TABLET | Freq: Three times a day (TID) | ORAL | Status: DC | PRN

## 2016-07-17 MED ORDER — ACETAMINOPHEN 325 MG PO TABS: 650.0000 mg | ORAL_TABLET | ORAL | Status: DC | PRN

## 2016-07-17 MED ORDER — ALUM & MAG HYDROXIDE-SIMETH 200-200-20 MG/5ML PO SUSP: 30.0000 mL | ORAL | Status: DC | PRN

## 2016-07-17 MED ORDER — ONDANSETRON HCL 4 MG PO TABS: 4.0000 mg | ORAL_TABLET | Freq: Three times a day (TID) | ORAL | Status: DC | PRN

## 2016-07-17 MED ORDER — NICOTINE 21 MG/24HR TD PT24
21.0000 mg | MEDICATED_PATCH | Freq: Every day | TRANSDERMAL | Status: DC
  Administered 2016-07-18: 21 mg via TRANSDERMAL
  Filled 2016-07-17: qty 1

## 2016-07-17 MED ORDER — IBUPROFEN 400 MG PO TABS: 600.0000 mg | ORAL_TABLET | Freq: Three times a day (TID) | ORAL | Status: DC | PRN

## 2016-07-17 MED ORDER — ZOLPIDEM TARTRATE 5 MG PO TABS: 5.0000 mg | ORAL_TABLET | Freq: Every evening | ORAL | Status: DC | PRN

## 2016-07-17 NOTE — ED Notes (Signed)
Security wanded at this time

## 2016-07-17 NOTE — ED Provider Notes (Signed)
[facility] Provider Note   CSN: [id number] Arrival date & time: 07/17/16  1909  History   Chief Complaint Chief Complaint  Patient presents with  . Suicidal    HPI [name] is a 30 y.o. male.  HPI   Patient to the ER with PMH of anxiety, depression, right clavicle.  Patient to the ER with complaints of depression and suicidal ideation. He states he is trying to get his SSI transferred but everyone is giving him the run around.  He says that he is homeless, he told the triage nurse that he plans to freeze outside in the cold. He does admit that he doesn't want to freeze outside but that he will.  Denies doing anything to harm himself. Denies drugs or alcohol. Denies hearing or seeing things that aren't there. Reports being given medication but no longer takes it, doesn't know why he no longer takes it.  Past Medical History:  Diagnosis Date  . Anxiety   . Depression   . Right clavicle fracture     Patient Active Problem List   Diagnosis Date Noted  . Psychosis 02/03/2013    Class: Chronic    History reviewed. No pertinent surgical history.   Home Medications    Prior to Admission medications   Medication Sig Start Date End Date Taking? Authorizing Provider  risperiDONE (RISPERDAL) 3 MG tablet Take 1 tablet (3 mg total) by mouth at bedtime. For mood stabilization. Patient not taking: Reported on 07/17/2016 02/07/13   Tamala JulianNeil T Mashburn, PA-C   Family History No family history on file.  Social History Social History  Substance Use Topics  . Smoking status: Current Every Day Smoker    Packs/day: 0.50    Types: Cigarettes  . Smokeless tobacco: Not on file  . Alcohol use Yes     Comment: occasionally    Allergies   Tomato  Review of Systems Review of Systems Review of Systems All other systems negative except as documented in the HPI. All pertinent positives and negatives as reviewed in the HPI.   Physical Exam Updated Vital Signs BP 114/81  (BP Location: Left Arm)   Pulse 62   Temp 97.9 F (36.6 C) (Oral)   Resp 16   SpO2 100%   Physical Exam  Constitutional: He appears well-developed and well-nourished. No distress.  HENT:  Head: Normocephalic and atraumatic.  Nose: Nose normal.  Mouth/Throat: Uvula is midline, oropharynx is clear and moist and mucous membranes are normal.  Eyes: Pupils are equal, round, and reactive to light.  Neck: Normal range of motion. Neck supple.  Cardiovascular: Normal rate and regular rhythm.   Pulmonary/Chest: Effort normal.  Abdominal: Soft.  Neurological: He is alert.  Acting at baseline  Skin: Skin is warm and dry. No rash noted.  Psychiatric: His speech is normal and behavior is normal. His mood appears anxious. He exhibits a depressed mood. He expresses suicidal ideation. He expresses no homicidal ideation. He expresses no suicidal plans and no homicidal plans.  Nursing note and vitals reviewed.  ED Treatments / Results  Labs (all labs ordered are listed, but only abnormal results are displayed) Labs Reviewed  ACETAMINOPHEN LEVEL - Abnormal; Notable for the following:       Result Value   Acetaminophen (Tylenol), Serum <10 (*)    All other components within normal limits  CBC - Abnormal; Notable for the following:    Hemoglobin 12.5 (*)    HCT 38.1 (*)    All other components  within normal limits  COMPREHENSIVE METABOLIC PANEL  ETHANOL  SALICYLATE LEVEL  RAPID URINE DRUG SCREEN, HOSP PERFORMED    EKG  EKG Interpretation None       Radiology No results found.  Procedures Procedures (including critical care time)  Medications Ordered in ED Medications  LORazepam (ATIVAN) tablet 1 mg (not administered)  acetaminophen (TYLENOL) tablet 650 mg (not administered)  ibuprofen (ADVIL,MOTRIN) tablet 600 mg (not administered)  zolpidem (AMBIEN) tablet 5 mg (not administered)  nicotine (NICODERM CQ - dosed in mg/24 hours) patch 21 mg (not administered)  ondansetron  (ZOFRAN) tablet 4 mg (not administered)  alum & mag hydroxide-simeth (MAALOX/MYLANTA) 200-200-20 MG/5ML suspension 30 mL (not administered)     Initial Impression / Assessment and Plan / ED Course  I have reviewed the triage vital signs and the nursing notes.  Pertinent labs & imaging results that were available during my care of the patient were reviewed by me and considered in my medical decision making (see chart for details).  Clinical Course     Final Clinical Impressions(s) / ED Diagnoses   Final diagnoses:  Homeless  Suicidal ideations   Psych holding orders placed Home meds reviewed and ordered as appropriate TTS consult ordered Labs pending are normal  Vitals:   07/17/16 1918  BP: 114/81  Pulse: 62  Resp: 16  Temp: 97.9 F (36.6 C)     New Prescriptions New Prescriptions   No medications on file     Marlon Peliffany Cloyd Ragas, PA-C 07/18/16 0012    Gilda Creasehristopher J Pollina, MD 07/18/16 [phone]

## 2016-07-17 NOTE — ED Triage Notes (Signed)
Pt was acting very strange in triage; pt gave different name initially when asked for his name; pt states he does not want to discuss "classificed" info with no one but the Md; pt then states he needs to get his SSI transferred because he is sleeping outside and needs a place to go before he kills himself; pt states his plan is to freeze to death; Pt asked directly if he was having SI and he states yes; Pt changed into paper scrubs and tech placed at bedside.

## 2016-07-18 ENCOUNTER — Inpatient Hospital Stay (HOSPITAL_COMMUNITY)
Admission: EM | Admit: 2016-07-18 | Discharge: 2016-07-28 | DRG: 885 | Disposition: A | Discharge status: Home / Self Care | Payer: Medicaid Other | Source: Intra-hospital | Attending: Psychiatry | Admitting: Psychiatry

## 2016-07-18 ENCOUNTER — Encounter (HOSPITAL_COMMUNITY): Payer: Self-pay

## 2016-07-18 DIAGNOSIS — F251 Schizoaffective disorder, depressive type: Principal | ICD-10-CM | POA: Diagnosis present

## 2016-07-18 DIAGNOSIS — Z590 Homelessness: Secondary | ICD-10-CM | POA: Diagnosis not present

## 2016-07-18 DIAGNOSIS — R45851 Suicidal ideations: Secondary | ICD-10-CM | POA: Diagnosis present

## 2016-07-18 DIAGNOSIS — X58XXXA Exposure to other specified factors, initial encounter: Secondary | ICD-10-CM | POA: Diagnosis present

## 2016-07-18 DIAGNOSIS — Z91018 Allergy to other foods: Secondary | ICD-10-CM | POA: Diagnosis not present

## 2016-07-18 DIAGNOSIS — Z9114 Patient's other noncompliance with medication regimen: Secondary | ICD-10-CM | POA: Diagnosis not present

## 2016-07-18 DIAGNOSIS — G4700 Insomnia, unspecified: Secondary | ICD-10-CM | POA: Diagnosis present

## 2016-07-18 DIAGNOSIS — S91114A Laceration without foreign body of right lesser toe(s) without damage to nail, initial encounter: Secondary | ICD-10-CM | POA: Diagnosis present

## 2016-07-18 DIAGNOSIS — F329 Major depressive disorder, single episode, unspecified: Secondary | ICD-10-CM | POA: Diagnosis present

## 2016-07-18 DIAGNOSIS — F1099 Alcohol use, unspecified with unspecified alcohol-induced disorder: Secondary | ICD-10-CM | POA: Diagnosis not present

## 2016-07-18 DIAGNOSIS — F17210 Nicotine dependence, cigarettes, uncomplicated: Secondary | ICD-10-CM | POA: Diagnosis present

## 2016-07-18 DIAGNOSIS — Z79899 Other long term (current) drug therapy: Secondary | ICD-10-CM | POA: Diagnosis not present

## 2016-07-18 DIAGNOSIS — F332 Major depressive disorder, recurrent severe without psychotic features: Secondary | ICD-10-CM | POA: Diagnosis not present

## 2016-07-18 DIAGNOSIS — F29 Unspecified psychosis not due to a substance or known physiological condition: Secondary | ICD-10-CM | POA: Diagnosis not present

## 2016-07-18 DIAGNOSIS — F419 Anxiety disorder, unspecified: Secondary | ICD-10-CM | POA: Diagnosis present

## 2016-07-18 MED ORDER — ALUM & MAG HYDROXIDE-SIMETH 200-200-20 MG/5ML PO SUSP: 30.0000 mL | ORAL | Status: DC | PRN

## 2016-07-18 MED ORDER — TRAZODONE HCL 50 MG PO TABS
50.0000 mg | ORAL_TABLET | Freq: Every evening | ORAL | Status: DC | PRN
  Filled 2016-07-18: qty 7
  Filled 2016-07-18 (×3): qty 1

## 2016-07-18 MED ORDER — NICOTINE 21 MG/24HR TD PT24
21.0000 mg | MEDICATED_PATCH | Freq: Every day | TRANSDERMAL | Status: DC
  Administered 2016-07-21: 21 mg via TRANSDERMAL
  Filled 2016-07-18 (×8): qty 1

## 2016-07-18 MED ORDER — MAGNESIUM HYDROXIDE 400 MG/5ML PO SUSP: 30.0000 mL | Freq: Every day | ORAL | Status: DC | PRN

## 2016-07-18 MED ORDER — CITALOPRAM HYDROBROMIDE 20 MG PO TABS
20.0000 mg | ORAL_TABLET | Freq: Every day | ORAL | Status: DC
  Filled 2016-07-18 (×5): qty 1

## 2016-07-18 MED ORDER — ACETAMINOPHEN 325 MG PO TABS
650.0000 mg | ORAL_TABLET | Freq: Four times a day (QID) | ORAL | Status: DC | PRN
  Administered 2016-07-20 – 2016-07-26 (×2): 650 mg via ORAL
  Filled 2016-07-18 (×2): qty 2

## 2016-07-18 MED ORDER — DIVALPROEX SODIUM ER 250 MG PO TB24
250.0000 mg | ORAL_TABLET | Freq: Every day | ORAL | Status: DC
  Administered 2016-07-18: 250 mg via ORAL
  Filled 2016-07-18 (×4): qty 1

## 2016-07-18 NOTE — ED Notes (Signed)
Pt has 2 labeled bags at nurses' desk.

## 2016-07-18 NOTE — Plan of Care (Signed)
Problem: Medication: Goal: Compliance with prescribed medication regimen will improve Outcome: Progressing Pt has been compliant with scheduled medication tonight.    

## 2016-07-18 NOTE — Tx Team (Signed)
Initial Treatment Plan 07/18/2016 2:35 PM Tou Charm BargesButler [id number]    PATIENT STRESSORS: Financial difficulties Health problems Occupational concerns Substance abuse   PATIENT STRENGTHS: Ability for insight Communication skills Motivation for treatment/growth   PATIENT IDENTIFIED PROBLEMS: Anxiety   Depression  Substance abuse  "be able to my SSI"  "Find a place to go when d/c"             DISCHARGE CRITERIA:  Ability to meet basic life and health needs Adequate post-discharge living arrangements Improved stabilization in mood, thinking, and/or behavior Motivation to continue treatment in a less acute level of care  PRELIMINARY DISCHARGE PLAN: Attend aftercare/continuing care group Attend PHP/IOP Attend 12-step recovery group Outpatient therapy  PATIENT/FAMILY INVOLVEMENT: This treatment plan has been presented to and reviewed with the patient, Talitha GivensDeshea Kerschner, and/or family member.  The patient and family have been given the opportunity to ask questions and make suggestions.  Bethann PunchesJane O Nicolena Schurman, RN 07/18/2016, 2:35 PM

## 2016-07-18 NOTE — ED Notes (Signed)
Per Staffing Office, no sitter available at this time.

## 2016-07-18 NOTE — ED Notes (Signed)
RN monitoring pt.
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
## 2016-07-18 NOTE — ED Notes (Signed)
Lunch ordered 

## 2016-07-18 NOTE — H&P (Signed)
Psychiatric Admission Assessment Adult  Patient Identification: [name] MRN:  [id number] Date of Evaluation:  07/18/2016 Chief Complaint:  bipolar disorder Principal Diagnosis: <principal problem not specified> Diagnosis:   Patient Active Problem List   Diagnosis Date Noted  . MDD (major depressive disorder) [F32.9] 07/18/2016  . Psychosis [F29] 02/03/2013    Class: Chronic   History of Present Illness:  [name] is an 30 y.o. male who came to [facility] via [facility]. Pt sts he was walking in a part of [place] he was not familiar with and he got scared and asked someone to call the police to help him.  He states that it was dark and he just wanted a place to sleep.  He states that he is homeless and he has no family.  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  He would like to get help to get his "SS check transferred" but offered little specifics on the matter.     Police brought him in and he denies at this assessment that he was suicidal.  He states that he did not mean what he said.  He feels frustrated because he thought that churches were open to people but they would not let him stay.  He had thoughts of jumping off a bridge but could not identify any specific bridge to jump from although he states that he did not mean it.   Per chart records, it is noted, patient states that he is homeless since moving to [place] from [place] a few months ago. Pt sts he has had trouble getting DSS to change his address for his SSI check. Pt sts he has not been compliant with his medications since he moved for the same reason, no money. Pt has been using cannabis, drinking alcohol occasionally and smoking cigarettes but, has had to cut down as he has no money to buy these things now. There was no alcohol or drugs in pt's system when tested in the [facility] tonight. Pt sts he had a psychiatrist he saw regularly in [place] along with a therapist (he sts he cannot recall their names.)  Pt  sts he has not been able to start seeing anyone in [place] yet.   Pt has been psychiatrically hospitalized 4 times at [facility] between 2004 and 2014. Nothing since 2014.  He is unable to remember the last time he took meds on a consistent basis or last time he saw a mental health provider.    Associated Signs/Symptoms: Depression Symptoms:  depressed mood, suicidal thoughts with specific plan, anxiety, disturbed sleep, (Hypo) Manic Symptoms:  Impulsivity, Irritable Mood, Labiality of Mood, Anxiety Symptoms:  Excessive Worry, Psychotic Symptoms:  Paranoia, PTSD Symptoms: NA Total Time spent with patient: 45 minutes  Past Psychiatric History: see HPI, he states that he has been on Depakote.    Is the patient at risk to self? Yes.    Has the patient been a risk to self in the past 6 months? Yes.    Has the patient been a risk to self within the distant past? Yes.    Is the patient a risk to others? No.  Has the patient been a risk to others in the past 6 months? No.  Has the patient been a risk to others within the distant past? No.   Prior Inpatient Therapy:   Prior Outpatient Therapy:    Alcohol Screening: Patient refused Alcohol Screening Tool: Yes 1. How often do you have a drink containing  alcohol?: Never 9. Have you or someone else been injured as a result of your drinking?: No 10. Has a relative or friend or a doctor or another health worker been concerned about your drinking or suggested you cut down?: Yes, but not in the last year Alcohol Use Disorder Identification Test Final Score (AUDIT): 2 Brief Intervention: Patient declined brief intervention Substance Abuse History in the last 12 months:  Yes.   Consequences of Substance Abuse: Medical Consequences:  inpatient admission Previous Psychotropic Medications: Yes  Psychological Evaluations: Yes  Past Medical History:  Past Medical History:  Diagnosis Date  . Anxiety   . Depression   . Right clavicle fracture     History reviewed. No pertinent surgical history. Family History: History reviewed. No pertinent family history. Family Psychiatric  History: see HPI Tobacco Screening: Have you used any form of tobacco in the last 30 days? (Cigarettes, Smokeless Tobacco, Cigars, and/or Pipes): Yes Tobacco use, Select all that apply: 5 or more cigarettes per day Are you interested in Tobacco Cessation Medications?: Yes, will notify MD for an order Counseled patient on smoking cessation including recognizing danger situations, developing coping skills and basic information about quitting provided: Yes (nicotine patch ordered) Social History:  History  Alcohol Use  . Yes    Comment: occasionally     History  Drug Use  . Types: Marijuana    Additional Social History:                           Allergies:   Allergies  Allergen Reactions  . Tomato Other (See Comments)    Dizziness   Lab Results:  Results for orders placed or performed during the hospital encounter of 07/17/16 (from the past 48 hour(s))  Comprehensive metabolic panel     Status: None   Collection Time: 07/17/16  7:49 PM  Result Value Ref Range   Sodium 140 135 - 145 mmol/L   Potassium 3.5 3.5 - 5.1 mmol/L   Chloride 108 101 - 111 mmol/L   CO2 22 22 - 32 mmol/L   Glucose, Bld 89 65 - 99 mg/dL   BUN 7 6 - 20 mg/dL   Creatinine, Ser 0.78 0.61 - 1.24 mg/dL   Calcium 9.5 8.9 - 10.3 mg/dL   Total Protein 6.5 6.5 - 8.1 g/dL   Albumin 4.1 3.5 - 5.0 g/dL   AST 27 15 - 41 U/L   ALT 19 17 - 63 U/L   Alkaline Phosphatase 57 38 - 126 U/L   Total Bilirubin 0.8 0.3 - 1.2 mg/dL   GFR calc non Af Amer >60 >60 mL/min   GFR calc Af Amer >60 >60 mL/min    Comment: (NOTE) The eGFR has been calculated using the CKD EPI equation. This calculation has not been validated in all clinical situations. eGFR's persistently <60 mL/min signify possible Chronic Kidney Disease.    Anion gap 10 5 - 15  Ethanol     Status: None   Collection  Time: 07/17/16  7:49 PM  Result Value Ref Range   Alcohol, Ethyl (B) <5 <5 mg/dL    Comment:        LOWEST DETECTABLE LIMIT FOR SERUM ALCOHOL IS 5 mg/dL FOR MEDICAL PURPOSES ONLY   Salicylate level     Status: None   Collection Time: 07/17/16  7:49 PM  Result Value Ref Range   Salicylate Lvl <1.6 2.8 - 30.0 mg/dL  Acetaminophen level  Status: Abnormal   Collection Time: 07/17/16  7:49 PM  Result Value Ref Range   Acetaminophen (Tylenol), Serum <10 (L) 10 - 30 ug/mL    Comment:        THERAPEUTIC CONCENTRATIONS VARY SIGNIFICANTLY. A RANGE OF 10-30 ug/mL MAY BE AN EFFECTIVE CONCENTRATION FOR MANY PATIENTS. HOWEVER, SOME ARE BEST TREATED AT CONCENTRATIONS OUTSIDE THIS RANGE. ACETAMINOPHEN CONCENTRATIONS >150 ug/mL AT 4 HOURS AFTER INGESTION AND >50 ug/mL AT 12 HOURS AFTER INGESTION ARE OFTEN ASSOCIATED WITH TOXIC REACTIONS.   cbc     Status: Abnormal   Collection Time: 07/17/16  7:49 PM  Result Value Ref Range   WBC 8.8 4.0 - 10.5 K/uL   RBC 4.44 4.22 - 5.81 MIL/uL   Hemoglobin 12.5 (L) 13.0 - 17.0 g/dL   HCT 38.1 (L) 39.0 - 52.0 %   MCV 85.8 78.0 - 100.0 fL   MCH 28.2 26.0 - 34.0 pg   MCHC 32.8 30.0 - 36.0 g/dL   RDW 12.7 11.5 - 15.5 %   Platelets 289 150 - 400 K/uL  Rapid urine drug screen (hospital performed)     Status: None   Collection Time: 07/17/16  7:51 PM  Result Value Ref Range   Opiates NONE DETECTED NONE DETECTED   Cocaine NONE DETECTED NONE DETECTED   Benzodiazepines NONE DETECTED NONE DETECTED   Amphetamines NONE DETECTED NONE DETECTED   Tetrahydrocannabinol NONE DETECTED NONE DETECTED   Barbiturates NONE DETECTED NONE DETECTED    Comment:        DRUG SCREEN FOR MEDICAL PURPOSES ONLY.  IF CONFIRMATION IS NEEDED FOR ANY PURPOSE, NOTIFY LAB WITHIN 5 DAYS.        LOWEST DETECTABLE LIMITS FOR URINE DRUG SCREEN Drug Class       Cutoff (ng/mL) Amphetamine      1000 Barbiturate      200 Benzodiazepine   800 Tricyclics       349 Opiates           300 Cocaine          300 THC              50     Blood Alcohol level:  Lab Results  Component Value Date   ETH <5 07/17/2016   ETH <5 17/91/5056    Metabolic Disorder Labs:  No results found for: HGBA1C, MPG No results found for: PROLACTIN No results found for: CHOL, TRIG, HDL, CHOLHDL, VLDL, LDLCALC  Current Medications: Current Facility-Administered Medications  Medication Dose Route Frequency Provider Last Rate Last Dose  . acetaminophen (TYLENOL) tablet 650 mg  650 mg Oral Q6H PRN Kerrie Buffalo, NP      . alum & mag hydroxide-simeth (MAALOX/MYLANTA) 200-200-20 MG/5ML suspension 30 mL  30 mL Oral Q4H PRN Kerrie Buffalo, NP      . magnesium hydroxide (MILK OF MAGNESIA) suspension 30 mL  30 mL Oral Daily PRN Kerrie Buffalo, NP      . traZODone (DESYREL) tablet 50 mg  50 mg Oral QHS PRN Kerrie Buffalo, NP       PTA Medications: Prescriptions Prior to Admission  Medication Sig Dispense Refill Last Dose  . risperiDONE (RISPERDAL) 3 MG tablet Take 1 tablet (3 mg total) by mouth at bedtime. For mood stabilization. (Patient not taking: Reported on 07/17/2016) 30 tablet 0 Not Taking at Unknown time    Musculoskeletal: Strength & Muscle Tone: within normal limits Gait & Station: normal Patient leans: N/A  Psychiatric Specialty Exam: Physical Exam  Nursing  note and vitals reviewed. Psychiatric: He has a normal mood and affect. His behavior is normal. Judgment and thought content normal.    ROS  Blood pressure 106/86, pulse 73, temperature 98.2 F (36.8 C), temperature source Oral, resp. rate 18, height 5' 3"  (1.6 m), weight 63.5 kg (140 lb).Body mass index is 24.8 kg/m.  General Appearance: Disheveled  Eye Contact:  Poor  Speech:  Garbled  Volume:  Decreased  Mood:  Anxious, Depressed and Irritable  Affect:  Constricted, Depressed and Flat  Thought Process:  Disorganized  Orientation:  Full (Time, Place, and Person)  Thought Content:  Rumination and Tangential   Suicidal Thoughts:  No  Homicidal Thoughts:  No  Memory:  Immediate;   Poor Recent;   Poor Remote;   Poor  Judgement:  Poor  Insight:  Lacking  Psychomotor Activity:  Normal  Concentration:  Concentration: Poor  Recall:  Poor  Fund of Knowledge:  Poor  Language:  Poor  Akathisia:  Negative  Handed:  Right  AIMS (if indicated):     Assets:  Physical Health  ADL's:  Intact  Cognition:  WNL  Sleep:       Treatment Plan Summary: Admit for crisis management and mood stabilization. Medication management to re-stabilize current mood symptoms Group counseling sessions for coping skills Medical consults as needed Review and reinstate any pertinent home medications for other health problems   Observation Level/Precautions:  15 minute checks  Laboratory:  per ED  Psychotherapy:  group  Medications:  See medlist  Consultations:  As needed  Discharge Concerns:  safety  Estimated LOS:2-7 days  Other:     Physician Treatment Plan for Primary Diagnosis: <principal problem not specified> Long Term Goal(s): Improvement in symptoms so as ready for discharge  Short Term Goals: Ability to identify changes in lifestyle to reduce recurrence of condition will improve, Ability to verbalize feelings will improve, Ability to disclose and discuss suicidal ideas, Ability to demonstrate self-control will improve, Ability to identify and develop effective coping behaviors will improve, Ability to maintain clinical measurements within normal limits will improve, Compliance with prescribed medications will improve and Ability to identify triggers associated with substance abuse/mental health issues will improve  Physician Treatment Plan for Secondary Diagnosis: Active Problems:   MDD (major depressive disorder)  Long Term Goal(s): Improvement in symptoms so as ready for discharge  Short Term Goals: Ability to identify changes in lifestyle to reduce recurrence of condition will improve, Ability to  verbalize feelings will improve, Ability to disclose and discuss suicidal ideas, Ability to demonstrate self-control will improve, Ability to identify and develop effective coping behaviors will improve, Ability to maintain clinical measurements within normal limits will improve, Compliance with prescribed medications will improve and Ability to identify triggers associated with substance abuse/mental health issues will improve  I certify that inpatient services furnished can reasonably be expected to improve the patient's condition.    Janett Labella, NP [facility] 11/14/20173:52 PM   I have reviewed case with NP and have met with patient  Agree with NP note and assessment  Patient is a 30 year old single male, currently homeless. Limited historian. He states he is originally from [place] area, and that he has had difficulty getting his SSI check transferred to [place], resulting in no source of income. Presented to ED reporting vague suicidal ideations, and also reporting concerns he was going to freeze to death. As per ED notes, patient initially presented guarded , paranoid, reluctant to share " classified "  information with staff. During later assessments he appeared better organized and not overtly psychotic .  Today patient presents depressed and irritable, intermittently loud, but no overt psychotic symptoms at this time. Expresses frustration about homelessness and inability to get his SSI check moved to [place]. Reports symptoms of depression such as decreased energy, poor sleep, sadness, suicidal ideations, although denies current plan or intention of hurting self and contracts for safety on unit. He also acknowledges feeling irritable and angry, but denies HI. Patient denies any medical illnesses . Patient denies any drug or alcohol abuse, and admission UDS and BAL negative. History of Depression, and prior psychiatric admissions, most recently 2014, at [facility]. At that time presented with bizarre  behaviors, suicidal ideations and also expressing homicidal ideations towards mother. Was diagnosed with Schizoaffective Disorder.  Was treated and stabilized  with Risperidone.  Patient remembers being on Depakote, Risperidone and " different antidepressants " in the past, but not specific names . Patient states he  has not been on any psychiatric medications in " a long time ".  Dx. MDD, consider Bipolar Disorder Depressed  Plan- inpatient admission - reviewed medication options with patient . Reluctant to take an atypical antipsychotic medication at this time. Agrees to Depakote ER , which was well tolerated in the past. Agrees to Celexa trial.

## 2016-07-18 NOTE — [facility] Suicide Risk Assessment (Signed)
[facility]   Nursing information obtained from:   patient and chart Demographic factors:   30 year old single male, currently homeless Current Mental Status:   see below Loss Factors:   homelessness, lack of support network Historical Factors:   depression, prior psychiatric admission 2014 Risk Reduction Factors:   resilience  Total Time spent with patient:45 minutes  Principal Problem:  MDD, Recurrent  Diagnosis:   Patient Active Problem List   Diagnosis Date Noted  . MDD (major depressive disorder) [F32.9] 07/18/2016  . Psychosis [F29] 02/03/2013    Class: Chronic    Continued Clinical Symptoms:  Alcohol Use Disorder Identification Test Final Score (AUDIT): 2 The "Alcohol Use Disorders Identification Test", Guidelines for Use in Primary Care, Second Edition.  World Science writerHealth Organization [facility](WHO). Score between 0-7:  no or low risk or alcohol related problems. Score between 8-15:  moderate risk of alcohol related problems. Score between 16-19:  high risk of alcohol related problems. Score 20 or above:  warrants further diagnostic evaluation for alcohol dependence and treatment.   CLINICAL FACTORS:  Patient is a 30 year old single male, currently homeless. Limited historian. He states he is [place] from [place] area, and that he has had difficulty getting his SSI check transferred to [place], resulting in no source of income. Presented to ED reporting vague suicidal ideations, and also reporting concerns he was going to freeze to death. As per ED notes, patient initially presented guarded , paranoid, reluctant to share " classified " information with staff. During later assessments he appeared better organized and not overtly psychotic .  Today patient presents depressed and irritable, intermittently loud, but no overt psychotic symptoms at this time. Expresses frustration about homelessness and inability to get his SSI check moved to [place]. Reports  symptoms of depression such as decreased energy, poor sleep, sadness, suicidal ideations, although denies current plan or intention of hurting self and contracts for safety on unit. He also acknowledges feeling irritable and angry, but denies HI. Patient denies any medical illnesses . Patient denies any drug or alcohol abuse, and admission UDS and BAL negative. History of Depression, and [facility] psychiatric [facility], [facility]. At that time presented with bizarre behaviors, suicidal ideations and also expressing homicidal ideations towards mother. Was diagnosed with Schizoaffective Disorder.  Was treated and stabilized  with Risperidone.  Patient remembers being on Depakote, Risperidone and " different antidepressants " in the past, but not specific names . Patient states he  has not been on any psychiatric medications in " a long time ".  Dx. MDD, consider Bipolar Disorder Depressed  Plan- inpatient admission - reviewed medication options with patient . Reluctant to take an atypical antipsychotic medication at this time. Agrees to Depakote ER , which was well tolerated in the past. Agrees to Celexa trial.    Musculoskeletal: Strength & Muscle Tone: within normal limits Gait & Station: normal Patient leans: N/A  Psychiatric Specialty Exam: Physical Exam  ROS denies headache, denies chest pain, denies shortness of breath, denies rash  Blood pressure 106/86, pulse 73, temperature 98.2 F (36.8 C), temperature source Oral, resp. rate 18, height 5\' 3"  (1.6 m), weight 140 lb (63.5 kg).Body mass index is 24.8 kg/m.  General Appearance: Fairly Groomed  Eye Contact:  Fair  Speech:  Normal Rate  Volume:  intermittently loud, irritable   Mood:  Depressed and Irritable  Affect:  irritable, restricted   Thought Process:  Linear  Orientation:  Full (Time,  Place, and Person)  Thought Content:  denies hallucinations, no delusions, not internally preoccupied at this time  Suicidal  Thoughts:  No at this time denies suicidal plan or intention, and contracts for safety on unit  Homicidal Thoughts:  No denies any homicidal or violent ideations   Memory:  recent and remote grossly intact   Judgement:  Fair  Insight:  Fair  Psychomotor Activity:  Normal  Concentration:  Concentration: Good and Attention Span: Good  Recall:  Good  Fund of Knowledge:  Good  Language:  Good  Akathisia:  Negative  Handed:  Right  AIMS (if indicated):     Assets:  Desire for Improvement Resilience  ADL's:  Intact  Cognition:  WNL  Sleep:         COGNITIVE FEATURES THAT CONTRIBUTE TO RISK:  Closed-mindedness and Loss of executive function    SUICIDE RISK:   Moderate:  Frequent suicidal ideation with limited intensity, and duration, some specificity in terms of plans, no associated intent, good self-control, limited dysphoria/symptomatology, some risk factors present, and identifiable protective factors, including available and accessible social support.   PLAN OF CARE: Patient will be admitted to inpatient psychiatric unit for stabilization and safety. Will provide and encourage milieu participation. Provide medication management and maked adjustments as needed.  Will follow daily.    I certify that inpatient services furnished can reasonably be expected to improve the patient's condition.  Nehemiah MassedOBOS, Raysean Graumann, MD 07/18/2016, 4:43 PM

## 2016-07-18 NOTE — ED Notes (Signed)
Patient completing TTS assessment at this time.  

## 2016-07-18 NOTE — ED Notes (Signed)
[facility]  [place] [facility]. Patient is ready for transport.

## 2016-07-18 NOTE — [facility] Group Notes (Signed)
[facility] LCSW Group Therapy 07/18/2016 1:15 PM  Type of Therapy: Group Therapy- Feelings about Diagnosis  Pt did not attend, declined invitation.   Vernie ShanksLauren Taiyana Kissler, LCSW 07/18/2016 4:08 PM

## 2016-07-18 NOTE — Progress Notes (Signed)
This a 30 yr old male admitted with complain of suicidal ideation. Pt stated he has been thinking of jumping off the bridge or freeze himself to death in the attempt to kill himself. Pt stated [place], was on SSI but since he moved, DSS has not been able o change his address for his SSI check. Pt is currently homeless and looking for placement. Pt was cooperative with admission process, was alert and oriented at the time. Skin search done, skin was clear except for the scar on his right leg. Endorses passive SI, but verbally contracted for safety. Pt introduced to the unit, food offered. Pt encouraged to ask questions if any. Q 15 min check ensured, will continue to monitor.

## 2016-07-18 NOTE — ED Notes (Signed)
[facility] notified for transport.  [facility], copies sent to medical records, and original documents placed in envelope [facility] with [facility].

## 2016-07-18 NOTE — Progress Notes (Signed)
D: Pt was in bed with his head under the covers upon initial approach.  Pt presents with depressed affect and mood.  Pt forwards little information to Clinical research associatewriter and he laughs inappropriately while interacting with Clinical research associatewriter.  Pt reports he is feeling "good."  He reports his goal is to "get a lot of sleep."  Pt denies SI/HI, denies hallucinations,.  Pt has stayed in his room tonight and he did not attend evening group. A: Introduced self to pt.  Actively listened to pt and offered support and encouragement. Medication administered per order.   R: Pt is safe on the unit.  Pt is compliant with medication.  Pt verbally contracts for safety.  Will continue to monitor and assess.

## 2016-07-19 LAB — TSH: TSH: 1.245 u[IU]/mL (ref 0.350–4.500)

## 2016-07-19 MED ORDER — RISPERIDONE 2 MG PO TABS
2.0000 mg | ORAL_TABLET | Freq: Every day | ORAL | Status: DC
  Filled 2016-07-19 (×3): qty 1

## 2016-07-19 MED ORDER — DIVALPROEX SODIUM ER 500 MG PO TB24
500.0000 mg | ORAL_TABLET | Freq: Every day | ORAL | Status: DC
  Administered 2016-07-19: 500 mg via ORAL
  Filled 2016-07-19 (×3): qty 1

## 2016-07-19 MED ORDER — LORAZEPAM 0.5 MG PO TABS: 0.5000 mg | ORAL_TABLET | Freq: Four times a day (QID) | ORAL | Status: DC | PRN

## 2016-07-19 NOTE — [facility] Group Notes (Signed)
[facility] LCSW Group Therapy 07/19/2016 1:15 PM  Type of Therapy: Group Therapy- Emotion Regulation  Pt did not attend, declined invitation.  Vernie ShanksLauren Martesha Niedermeier, LCSW 07/19/2016 3:52 PM

## 2016-07-19 NOTE — Progress Notes (Signed)
Patient ID: Kenneth GivensDeshea Robinson, male   DOB: [birth year], 30 y.o.   MRN: [id number]  Pt currently presents with a flat affect and isolative/agitated/suspicious behavior. Pt presents with minimal responses to writers questions, concrete thinking and slow processing." Pt states "I told that doctor that I am not taking anything other than depakote." Pt reports good sleep with current medication regimen. Pt appears to be responding to internal stimuli, looking around the room and waving his hand in the air. Pt reports that he is hungry, needs an extra toothbrush after probing.  Pt provided with medications per providers orders. Pt's labs and vitals were monitored throughout the night. Pt supported emotionally and encouraged to express concerns and questions. Pt educated on medications. Given snack and toiletries. Encouraged pt to participate in milieu.   Pt's safety ensured with 15 minute and environmental checks. Pt currently denies SI/HI and A/V hallucinations. Pt verbally agrees to seek staff if SI/HI or A/VH occurs and to consult with staff before acting on any harmful thoughts. Pt does not come out of his room this afternoon or tonight. Will continue POC.

## 2016-07-19 NOTE — Progress Notes (Signed)
[facility] MD Progress Note  07/19/2016 3:12 PM [name]  MRN:  [id number] Subjective:  Patient reports he is feeling better. He states " all I need is a payee ". At this time denies medication side effects, but states " I only want to take Depakote,it helps, and I don't get side effects".  Objective : Have met with patient and discussed case with treatment team. Remains isolative, spending most time in his room. Remains irritable, vaguely angry, but not threatening , and somewhat less guarded , better related, as evidenced by improved eye contact and shaking writer's hand today. Does continue to escalate easily, and frequently makes statements such as " I have no time for BS or lies, I only want to talk about what is true and important ".  Denies hallucinations, and does not appear internally preoccupied, but does present with some peculiar, paranoid  ideations. States " people don't treat me right because I am not a pastor".  Remains somewhat disorganized in thought process but somewhat better able to review his stressors today- states that he has a payee in [place] " Jeneen Rinks "- unable to report if Jeneen Rinks is a family member. States he is now homeless and is not getting his disability monies, which presumably " Jeneen Rinks " is continuing to get. Wants to change payees or become his own payee to correct this .  Of note denies any violent ideations. Regarding medications,states he will take  Depakote ER at this time, which he states has been effective medication for him, but reluctant to take Celexa or other medications at present. History of good response to Risperidone in the past, have encouraged him to consider adding this medication to treatment regimen Labs- TSH WNL.   Principal Problem:  Depression, consider Schizoaffective Disorder  Diagnosis:   Patient Active Problem List   Diagnosis Date Noted  . MDD (major depressive disorder) [F32.9] 07/18/2016  . Severe episode of recurrent major depressive disorder,  without psychotic features ([place]) [F33.2]   . Psychosis [F29] 02/03/2013    Class: Chronic   Total Time spent with patient: 20 minutes    Past Medical History:  Past Medical History:  Diagnosis Date  . Anxiety   . Depression   . Right clavicle fracture    History reviewed. No pertinent surgical history. Family History: History reviewed. No pertinent family history.  Social History:  History  Alcohol Use  . Yes    Comment: occasionally     History  Drug Use  . Types: Marijuana    Social History   Social History  . Marital status: Single    Spouse name: N/A  . Number of children: N/A  . Years of education: N/A   Social History Main Topics  . Smoking status: Current Every Day Smoker    Packs/day: 0.50    Types: Cigarettes  . Smokeless tobacco: Never Used  . Alcohol use Yes     Comment: occasionally  . Drug use:     Types: Marijuana  . Sexual activity: No   Other Topics Concern  . None   Social History Narrative  . None   Additional Social History:   Sleep: Fair  Appetite:  Fair  Current Medications: Current Facility-Administered Medications  Medication Dose Route Frequency Provider Last Rate Last Dose  . acetaminophen (TYLENOL) tablet 650 mg  650 mg Oral Q6H PRN Kerrie Buffalo, NP      . alum & mag hydroxide-simeth (MAALOX/MYLANTA) 200-200-20 MG/5ML suspension 30 mL  30 mL Oral  Q4H PRN Kerrie Buffalo, NP      . citalopram (CELEXA) tablet 20 mg  20 mg Oral Daily Jenne Campus, MD      . divalproex (DEPAKOTE ER) 24 hr tablet 250 mg  250 mg Oral QHS Jenne Campus, MD   250 mg at 07/18/16 2116  . magnesium hydroxide (MILK OF MAGNESIA) suspension 30 mL  30 mL Oral Daily PRN Kerrie Buffalo, NP      . nicotine (NICODERM CQ - dosed in mg/24 hours) patch 21 mg  21 mg Transdermal Daily Jenne Campus, MD      . traZODone (DESYREL) tablet 50 mg  50 mg Oral QHS PRN Kerrie Buffalo, NP        Lab Results:  Results for orders placed or performed during the  hospital encounter of 07/18/16 (from the past 48 hour(s))  TSH     Status: None   Collection Time: 07/19/16  6:31 AM  Result Value Ref Range   TSH 1.245 0.350 - 4.500 uIU/mL    Comment: Performed by a 3rd Generation assay with a functional sensitivity of <=0.01 uIU/mL. Performed at [facility]     Blood Alcohol level:  Lab Results  Component Value Date   [facility] <5 07/17/2016   ETH <5 40/06/2724    Metabolic Disorder Labs: No results found for: HGBA1C, MPG No results found for: PROLACTIN No results found for: CHOL, TRIG, HDL, CHOLHDL, VLDL, LDLCALC  Physical Findings: AIMS: Facial and Oral Movements Muscles of Facial Expression: None, normal Lips and Perioral Area: None, normal Jaw: None, normal Tongue: None, normal,Extremity Movements Upper (arms, wrists, hands, fingers): None, normal Lower (legs, knees, ankles, toes): None, normal, Trunk Movements Neck, shoulders, hips: None, normal, Overall Severity Severity of abnormal movements (highest score from questions above): None, normal Incapacitation due to abnormal movements: None, normal Patient's awareness of abnormal movements (rate only patient's report): No Awareness, Dental Status Current problems with teeth and/or dentures?: No Does patient usually wear dentures?: No  CIWA:  CIWA-Ar Total: 4 COWS:     Musculoskeletal: Strength & Muscle Tone: within normal limits Gait & Station: normal Patient leans: N/A  Psychiatric Specialty Exam: Physical Exam  ROS denies headache, denies chest pain, no nausea or vomiting  Blood pressure (!) 121/102, pulse (!) 54, temperature 98.3 F (36.8 C), temperature source Oral, resp. rate 18, height 5' 3"  (1.6 m), weight 140 lb (63.5 kg).Body mass index is 24.8 kg/m.  General Appearance: Fairly Groomed, but improved compared to admission   Eye Contact:  Good  Speech:  Normal Rate  Volume:  variable, loud at times   Mood:  states he is feeling better  Affect:  remains  irritable , labile   Thought Process:  Becomes disorganized with open ended questions, but improved compared to initial presentation  Orientation:  Other:  fully alert and attentive  Thought Content:  denies hallucinations, not internally preoccupied, no overt delusions, but presents vaguely paranoid   Suicidal Thoughts:  No denies any suicidal or self injurious ideations and denies any homicidal or violent ideations   Homicidal Thoughts:  No  Memory:  recent and remote grossly intact   Judgement:  Fair  Insight:  Lacking  Psychomotor Activity:  Normal  Concentration:  Concentration: Good and Attention Span: Good  Recall:  Good  Fund of Knowledge:  Good  Language:  Good  Akathisia:  Negative  Handed:  Right  AIMS (if indicated):     Assets:  Desire for  Improvement Resilience  ADL's:   Fair   Cognition:  WNL  Sleep:  Number of Hours: 6.5   Assessment - patient partially improved compared to admission, but remains labile, irritable, guarded, isolative. Thought process better organized, but still loose at times. Denies suicidal or homicidal ideations and has not been overtly agitated or threatening . Better able to make his needs known today, focused on having some type of housing or shelter after discharge and being able to get his Disability checks.  Treatment Plan Summary: Daily contact with patient to assess and evaluate symptoms and progress in treatment, Medication management, Plan inpatient treatment  and medications as below Encourage increased milieu and group participation to work on coping skills and symptom reduction Increase Depakote ER to 500 mgrs QHS for mood disorder , irritability Continue Trazodone 50 mgrs QHS PRN for insomnia as needed Start Ativan 0.5 mgrs Q 6 hours PRN anxiety, agitation  Patient refusing Celexa at this time, so will D/C  Start Risperidone 2 mgrs QHS for psychosis, thought disorder- encourage compliance Treatment team/CSW working with patient on  disposition planning options

## 2016-07-19 NOTE — Progress Notes (Signed)
Recreation Therapy Notes  Date: 07/19/16 Time: 0930 Location: [address] Dayroom  Group Topic: Stress Management  Goal Area(s) Addresses:  Patient will verbalize importance of using healthy stress management.  Patient will identify positive emotions associated with healthy stress management.   Intervention: Calm App  Activity :  Resilience Meditation.  LRT introduced the stress management technique of meditation.  LRT played a meditation to help patients improve resilience.  Patients were to follow along with the recording to the best of their ability to engaged in the technique.  Education:  Stress Management, Discharge Planning.   Education Outcome: Acknowledges edcuation/In group clarification offered/Needs additional education  Clinical Observations/Feedback: Pt did not attend group.    Caroll RancherMarjette Amayrani Bennick, LRT/CTRS         Caroll RancherLindsay, Adalay Azucena A 07/19/2016 12:10 PM

## 2016-07-19 NOTE — Progress Notes (Signed)
Patient did not attend wrap up group. 

## 2016-07-19 NOTE — Tx Team (Signed)
Interdisciplinary Treatment and Diagnostic Plan Update  07/19/2016 Time of Session: 12:53 PM  [name] MRN: [id number]  Principal Diagnosis: <principal problem not specified>  Secondary Diagnoses: Active Problems:   MDD (major depressive disorder)   Severe episode of recurrent major depressive disorder, without psychotic features ([place])   Current Medications:  Current Facility-Administered Medications  Medication Dose Route Frequency Provider Last Rate Last Dose  . acetaminophen (TYLENOL) tablet 650 mg  650 mg Oral Q6H PRN Kerrie Buffalo, NP      . alum & mag hydroxide-simeth (MAALOX/MYLANTA) 200-200-20 MG/5ML suspension 30 mL  30 mL Oral Q4H PRN Kerrie Buffalo, NP      . citalopram (CELEXA) tablet 20 mg  20 mg Oral Daily Jenne Campus, MD      . divalproex (DEPAKOTE ER) 24 hr tablet 250 mg  250 mg Oral QHS Jenne Campus, MD   250 mg at 07/18/16 2116  . magnesium hydroxide (MILK OF MAGNESIA) suspension 30 mL  30 mL Oral Daily PRN Kerrie Buffalo, NP      . nicotine (NICODERM CQ - dosed in mg/24 hours) patch 21 mg  21 mg Transdermal Daily Myer Peer Cobos, MD      . traZODone (DESYREL) tablet 50 mg  50 mg Oral QHS PRN Kerrie Buffalo, NP        PTA Medications: Prescriptions Prior to Admission  Medication Sig Dispense Refill Last Dose  . risperiDONE (RISPERDAL) 3 MG tablet Take 1 tablet (3 mg total) by mouth at bedtime. For mood stabilization. (Patient not taking: Reported on 07/17/2016) 30 tablet 0 Not Taking at Unknown time    Treatment Modalities: Medication Management, Group therapy, Case management,  1 to 1 session with clinician, Psychoeducation, Recreational therapy.  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  Physician Treatment Plan for Primary Diagnosis: <principal problem not specified> Long Term Goal(s):  Improvement in symptoms so as ready for discharge  Short Term Goals: Ability to identify changes in lifestyle to reduce recurrence of condition will improve Ability to verbalize feelings will improve Ability to disclose and discuss suicidal ideas Ability to demonstrate self-control will improve Ability to identify and develop effective coping behaviors will improve Ability to maintain clinical measurements within normal limits will improve Compliance with prescribed medications will improve Ability to identify triggers associated with substance abuse/mental health issues will improve Ability to identify changes in lifestyle to reduce recurrence of condition will improve Ability to verbalize feelings will improve Ability to disclose and discuss suicidal ideas Ability to demonstrate self-control will improve Ability to identify and develop effective coping behaviors will improve Ability to maintain clinical measurements within normal limits will improve Compliance with prescribed medications will improve Ability to identify triggers associated with substance abuse/mental health issues will improve  Medication Management: Evaluate patient's response, side effects, and tolerance of medication regimen.  Therapeutic Interventions: 1 to 1 sessions, Unit Group sessions and Medication administration.  Evaluation of Outcomes: Not Met  Physician Treatment Plan for Secondary Diagnosis: Active Problems:   MDD (major depressive disorder)   Severe episode of recurrent major depressive disorder, without psychotic features ([place])   Long Term Goal(s): Improvement in symptoms so as ready for discharge  Short Term Goals: Ability to identify changes in lifestyle to reduce recurrence of condition will improve Ability to verbalize feelings will improve Ability to disclose and discuss suicidal ideas Ability to demonstrate self-control will improve Ability to identify and  develop effective coping behaviors  will improve Ability to maintain clinical measurements within normal limits will improve Compliance with prescribed medications will improve Ability to identify triggers associated with substance abuse/mental health issues will improve Ability to identify changes in lifestyle to reduce recurrence of condition will improve Ability to verbalize feelings will improve Ability to disclose and discuss suicidal ideas Ability to demonstrate self-control will improve Ability to identify and develop effective coping behaviors will improve Ability to maintain clinical measurements within normal limits will improve Compliance with prescribed medications will improve Ability to identify triggers associated with substance abuse/mental health issues will improve  Medication Management: Evaluate patient's response, side effects, and tolerance of medication regimen.  Therapeutic Interventions: 1 to 1 sessions, Unit Group sessions and Medication administration.  Evaluation of Outcomes: Not Met   RN Treatment Plan for Primary Diagnosis: <principal problem not specified> Long Term Goal(s): Knowledge of disease and therapeutic regimen to maintain health will improve  Short Term Goals: Ability to verbalize feelings will improve, Ability to disclose and discuss suicidal ideas and Ability to identify and develop effective coping behaviors will improve  Medication Management: RN will administer medications as ordered by provider, will assess and evaluate patient's response and provide education to patient for prescribed medication. RN will report any adverse and/or side effects to prescribing provider.  Therapeutic Interventions: 1 on 1 counseling sessions, Psychoeducation, Medication administration, Evaluate responses to treatment, Monitor vital signs and CBGs as ordered, Perform/monitor CIWA, COWS, AIMS and Fall Risk screenings as ordered, Perform wound care treatments as ordered.  Evaluation of Outcomes: Not  Met   LCSW Treatment Plan for Primary Diagnosis: <principal problem not specified> Long Term Goal(s): Safe transition to appropriate next level of care at discharge, Engage patient in therapeutic group addressing interpersonal concerns.  Short Term Goals: Engage patient in aftercare planning with referrals and resources, Increase emotional regulation, Facilitate acceptance of mental health diagnosis and concerns and Increase skills for wellness and recovery  Therapeutic Interventions: Assess for all discharge needs, 1 to 1 time with Social worker, Explore available resources and support systems, Assess for adequacy in community support network, Educate family and significant other(s) on suicide prevention, Complete Psychosocial Assessment, Interpersonal group therapy.  Evaluation of Outcomes: Not Met   Progress in Treatment: Attending groups: Pt is new to milieu, continuing to assess  Participating in groups: Pt is new to milieu, continuing to assess  Taking medication as prescribed: Yes, MD continues to assess for medication changes as needed Toleration medication: Yes, no side effects reported at this time Family/Significant other contact made: No, CSW assessing for appropriate contact Patient understands diagnosis: Continuing to assess Discussing patient identified problems/goals with staff: Yes Medical problems stabilized or resolved: Yes Denies suicidal/homicidal ideation: Recently admitted with SI Issues/concerns per patient self-inventory: None Other: N/A  New problem(s) identified: None identified at this time.   New Short Term/Long Term Goal(s): None identified at this time.   Discharge Plan or Barriers: CSW will assess for appropriate discharge plan and relevant barriers.   Reason for Continuation of Hospitalization: Anxiety Depression Medication stabilization Suicidal ideation  Estimated Length of Stay: 3-5 days  Attendees: Patient: 07/19/2016  12:53 PM   Physician: Dr. Parke Poisson 07/19/2016  12:53 PM  Nursing: Eulogio Bear, RN; Loletta Specter, RN 07/19/2016  12:53 PM  RN Care Manager: Lars Pinks, RN 07/19/2016  12:53 PM  Social Worker: Adriana Reams, LCSW; Erasmo Downer Drinkard, LCSW 07/19/2016  12:53 PM  Recreational Therapist:  07/19/2016  12:53 PM  Other: Catalina Pizza,  NP; Samuel Jester, NP 07/19/2016  12:53 PM  Other:  07/19/2016  12:53 PM  Other: 07/19/2016  12:53 PM    Scribe for Treatment Team: Gladstone Lighter, LCSW 07/19/2016 12:53 PM

## 2016-07-19 NOTE — Progress Notes (Signed)
D:Pt has been in his bed much of the day irritable and demanding when out of his room. Pt refused morning medication and group this morning. A:Offered support and 15 minute checks. R:Pt denies si and hi. Safety maintained on the unit.

## 2016-07-19 NOTE — BHH Counselor (Signed)
Adult Comprehensive Assessment  Patient ID: Kenneth GivensDeshea Robinson, male   DOB: Feb 28, 1986, 30 y.o.   MRN: [id number]  Information Source: Information source: Patient  Current Stressors:  Educational / Learning stressors: None reported Employment / Job issues: Unemployed as he is on disability Family Relationships:reports significant conflict and tension with family who [place] living [place] 2 months ago Surveyor, quantityinancial / Lack of resources (include bankruptcy): has a Theme park managerpayee (grandfather) Housing / Lack of housing: Homeless since [place] ago Physical health (include injuries & life threatening diseases): Denies Social relationships: None reported Substance abuse: "I haven't in a while" Bereavement / Loss: Denies  Living/Environment/Situation:  Living Arrangements: Alone[place] Living conditions (as described by patient or guardian): was living with family in [place], however left due to "drama" and has been homeless since How long has patient lived in current situation?: 2 months What is atmosphere in current home: Chaotic, Transient  Family History:  Marital status: Single Does patient have children?: No  Childhood History:  By whom was/is the patient raised?: Foster parents Additional childhood history information: Was in custody of DSS from age 89 or so.  Went to a variety of foster parents, maybe "a handful" Description of patient's relationship with caregiver when they were a child: Kenneth RearLottie Robinson was his main DSS worker - "cool" relationship; Foster parents were pretty god Patient's description of current relationship with people who raised him/her: No relationship now. Does patient have siblings?: Yes Number of Siblings: 5 (1 brother, 4 sisters) Description of patient's current relationship with siblings: Don't chat much Did patient suffer any verbal/emotional/physical/sexual abuse as a child?: No Did patient suffer from severe childhood  neglect?: Yes Patient description of severe childhood neglect: Was removed from parents' home at age 89 due to neglect.  They were using drugs, neglecting his needs. Has patient ever been sexually abused/assaulted/raped as an adolescent or adult?: No Was the patient ever a victim of a crime or a disaster?: No Witnessed domestic violence?: No Has patient been effected by domestic violence as an adult?: No  Education:  Highest grade of school patient has completed: 9th Currently a student?: No Learning disability?: Yes What learning problems does patient have?: Understanding/reading  Employment/Work Situation:   Employment situation: On disability Why is patient on disability:  mental health diagnosis How long has patient been on disability: Since age [age] What is the longest time patient has a held a job?: 10 months Where was the patient employed at that time?: fast food Has patient ever served in Buyer, retailcombat?: No  Financial Resources:   Surveyor, quantityinancial resources: [employer]ives SSI;Medicaid Does patient have a representative payee or guardian?: Yes Name of representative payee or guardian:[name], grandfather  Alcohol/Substance Abuse:   What has been your use of drugs/alcohol within the last 12 months?: Denies recent use If attempted suicide, did drugs/alcohol play a role in this?: No Alcohol/Substance Abuse Treatment Hx: Denies past history Has alcohol/substance abuse ever caused legal problems?: No  Social Support System:   Conservation officer, natureatient's Community Support System: Poor Describe Community Support System: pt feels alone Type of faith/religion: Unknown How does patient's faith help to cope with current illness?: Unknown  Leisure/Recreation:   Leisure and Hobbies: Read books, playing board games and cards  Strengths/Needs:   What things does the patient do well?: Staying busy In what areas does patient struggle / problems for patient: Accepting the word "no".  Self-pity    Discharge Plan:   Plan for no  access to transportation at discharge: Need a bus pass Will patient be returning to same living situation after discharge?: No- Pt is unsure of discharge plan at this time. Currently receiving community mental health services: No If no, would patient like referral for services when discharged?: Yes ([place]) Does patient have financial barriers related to discharge medications?: No  Summary/Recommendations:  Patient is a 30 year old male with a diagnosis of Major Depressive Disorder with history of psychosis. Pt presented to the hospital with thoughts of suicide and increased depression. Pt reports primary trigger(s) for admission include homelessness and having difficulty getting his payee changed. Patient will benefit from crisis stabilization, medication evaluation, group therapy and psycho education in addition to case management for discharge planning. At discharge it is recommended that Pt remain compliant with established discharge plan and continued treatment.   Kenneth ShanksLauren Jordon Bourquin, LCSW Clinical Social Work [phone]

## 2016-07-20 MED ORDER — BENZTROPINE MESYLATE 1 MG/ML IJ SOLN
0.5000 mg | Freq: Two times a day (BID) | INTRAMUSCULAR | Status: DC
  Filled 2016-07-20 (×12): qty 0.5

## 2016-07-20 MED ORDER — OLANZAPINE 10 MG IM SOLR
10.0000 mg | Freq: Three times a day (TID) | INTRAMUSCULAR | Status: DC | PRN
  Filled 2016-07-20: qty 10

## 2016-07-20 MED ORDER — BENZTROPINE MESYLATE 0.5 MG PO TABS
0.5000 mg | ORAL_TABLET | Freq: Two times a day (BID) | ORAL | Status: DC
  Administered 2016-07-20 – 2016-07-28 (×16): 0.5 mg via ORAL
  Filled 2016-07-20 (×11): qty 1
  Filled 2016-07-20: qty 14
  Filled 2016-07-20 (×4): qty 1
  Filled 2016-07-20: qty 14
  Filled 2016-07-20 (×5): qty 1

## 2016-07-20 MED ORDER — HALOPERIDOL 5 MG PO TABS
5.0000 mg | ORAL_TABLET | Freq: Two times a day (BID) | ORAL | Status: DC
  Administered 2016-07-20 – 2016-07-28 (×16): 5 mg via ORAL
  Filled 2016-07-20: qty 14
  Filled 2016-07-20 (×10): qty 1
  Filled 2016-07-20: qty 14
  Filled 2016-07-20 (×10): qty 1

## 2016-07-20 MED ORDER — OLANZAPINE 10 MG PO TABS
10.0000 mg | ORAL_TABLET | Freq: Three times a day (TID) | ORAL | Status: DC | PRN
  Administered 2016-07-20: 10 mg via ORAL
  Filled 2016-07-20: qty 1

## 2016-07-20 MED ORDER — HALOPERIDOL LACTATE 5 MG/ML IJ SOLN
5.0000 mg | Freq: Two times a day (BID) | INTRAMUSCULAR | Status: DC
  Filled 2016-07-20 (×12): qty 1

## 2016-07-20 MED ORDER — LORAZEPAM 2 MG/ML IJ SOLN: 1.0000 mg | Freq: Four times a day (QID) | INTRAMUSCULAR | Status: DC | PRN

## 2016-07-20 MED ORDER — DIVALPROEX SODIUM ER 500 MG PO TB24
1000.0000 mg | ORAL_TABLET | Freq: Every day | ORAL | Status: DC
  Administered 2016-07-23: 500 mg via ORAL
  Filled 2016-07-20 (×5): qty 2

## 2016-07-20 MED ORDER — LORAZEPAM 1 MG PO TABS
1.0000 mg | ORAL_TABLET | Freq: Four times a day (QID) | ORAL | Status: DC | PRN
  Administered 2016-07-20 – 2016-07-21 (×2): 1 mg via ORAL
  Filled 2016-07-20 (×3): qty 1

## 2016-07-20 NOTE — Progress Notes (Signed)
D:Pt has been laughing out loud at inappropriate times, looking around and pacing in the hall today. Pt is agitated and irritable refusing medications and demanding [place]. Pt has been evaluated by two MDs. Received order to place pt on 500 hall. A:Pt moved to 500 hall. Offered support and redirection. Encouraged pt to take medications.  R:Pt verbally denies si and hi. Safety maintained on the unit.
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
## 2016-07-20 NOTE — Progress Notes (Signed)
[facility] Progress Note for Medication Administration to Non-consenting Patients (For Involuntarily Committed Patients)  Patient: Kenneth GivensDeshea Cubbage Date of Birth: [id number]-Aug-1987 MRN: [id number]  Reason for the Medication: There is, without the benefit of the specific treatment measure, a significant possibility that the patient will harm self or others before improvement of the patient's condition is realized.  Consideration of Side Effects: Consideration of the side effects related to the medication plan has been given.  Rationale for Medication Administration: Patient is disorganized , delusional, paranoid , agitated , lacks insight and is not able to participate in treatment plan, refuses medications offered .    Ashiyah Pavlak, MD 07/20/16  2:21 PM   This documentation is good for (7) seven days from the date of the MD signature. New documentation must be completed every seven (7) days with detailed justification in the medical record if the patient requires continued non-emergent administration of psychotropic medications.

## 2016-07-20 NOTE — [facility] Group Notes (Signed)
[facility] 07/20/2016 1:15pm  Type of Therapy: Mental Health Association Presentation  Pt did not attend, declined invitation.    Vernie ShanksLauren Jedrek Dinovo, LCSW 07/20/2016 1:29 PM

## 2016-07-21 ENCOUNTER — Encounter (HOSPITAL_COMMUNITY): Payer: Self-pay | Admitting: Psychiatry

## 2016-07-21 DIAGNOSIS — F251 Schizoaffective disorder, depressive type: Secondary | ICD-10-CM | POA: Diagnosis present

## 2016-07-21 LAB — LIPID PANEL
CHOLESTEROL: 190 mg/dL (ref 0–200)
HDL: 60 mg/dL (ref >40)
LDL CALC: 103 mg/dL — AB (ref 0–99)
Total CHOL/HDL Ratio: 3.2 RATIO
Triglycerides: 137 mg/dL (ref <150)
VLDL: 27 mg/dL (ref 0–40)

## 2016-07-21 MED ORDER — BACITRACIN ZINC 500 UNIT/GM EX OINT
TOPICAL_OINTMENT | Freq: Three times a day (TID) | CUTANEOUS | Status: DC
  Administered 2016-07-21 – 2016-07-28 (×18): via TOPICAL
  Filled 2016-07-21: qty 28.35

## 2016-07-21 NOTE — Progress Notes (Signed)
Data. Patient denies SI/HI/AVH. Patient took both his AM and evening medications in oral form. He was very agitated taking his meds, but he did take them. At times patient is pleasant with staff and at other times he calls staff names and is very irritable. His right plantar aspect of his great toe was cleaned, dressed and ABT oint applied, per order. MD and NP notified of R. Second toe and it's discoloration on the tip.On his self assessment patient reports 0/10 for anxiety, depression and hopelessness. His goal today is: "Have the balance to do what I need to do for my family"..   Action. Emotional support and encouragement offered. Education provided on medication, indications and side effect. Q 15 minute checks done for safety. Response. Safety on the unit maintained through 15 minute checks.  Medications taken as prescribed. Attended groups.

## 2016-07-21 NOTE — Progress Notes (Signed)
Patient woke around 5am. Requested for snack. Complained of R foot pain. Barbara CowerJason, NP notified -examined the leg and requested to be referred to a Dr. this am. Patient in day room watching TV at the moment. Calm and cooperative. Will continue to monitor patient.

## 2016-07-21 NOTE — Progress Notes (Signed)
Met patient sleeping. Continues to sleep at this moment. Patient refused to wake up on several attempts. Respiration even and unlabored. No distress noted. Will continue to monitor patient for safety and stability.

## 2016-07-21 NOTE — Progress Notes (Signed)
Recreation Therapy Notes  Date: 07/20/16 Time: 1000 Location: 500 Hall Dayroom  Group Topic: Stress Management  Goal Area(s) Addresses:  Patient will verbalize importance of using healthy stress management.  Patient will identify positive emotions associated with healthy stress management.   Behavioral Response: Observed  Intervention: Guided Training and development officermagery Script, Calm App  Activity :  Child psychotherapistavorite Place Imagery; Body Scan Meditation.  LRT introduced the stress management techniques of guided imagery and meditation to patients.  Patients were given to opportunity participate in the techniques by following along as LRT read script for the guided imagery and listening to the recorded meditation.  Education:  Stress Management, Discharge Planning.   Education Outcome: Acknowledges edcuation/In group clarification offered/Needs additional education  Clinical Observations/Feedback: Pt stood the whole time but was quiet.  Pt did not participate but was observant and respectful to peers that did participate.  Pt had one instance of inappropriate laughing.      Caroll RancherMarjette Dalena Plantz, LRT/CTRS      Caroll RancherLindsay, Clarisse Rodriges A 07/21/2016 11:39 AM

## 2016-07-21 NOTE — [facility] Group Notes (Signed)
[facility] LCSW Group Therapy  07/21/2016  1:05 PM  Type of Therapy:  Group therapy  Participation Level:  Active  Participation Quality:  Attentive  Affect:  Flat  Cognitive:  Oriented  Insight:  Limited  Engagement in Therapy:  Limited  Modes of Intervention:  Discussion, Socialization  Summary of Progress/Problems:  Chaplain was here to lead a group on themes of hope and courage. Started out group standing by the door.  Stayed there the entire time, but engaged throughout.  Stated he has not really gotten to know folks here yet, so others encouraged him to continue to come to groups and the day room so they could get to know him better. "I don't need any courage.  I have plenty already."  Colleenfortorth, Baldo Hufnagle B 07/21/2016 1:22 PM

## 2016-07-21 NOTE — Plan of Care (Signed)
Problem: Education: Goal: Knowledge of the prescribed therapeutic regimen will improve Outcome: Not Progressing Patient continues to become irritable and argumentative when nurse attempts to administer medications.

## 2016-07-21 NOTE — Progress Notes (Signed)
[facility]  07/21/2016 11:23 AM [name]  MRN:  [id number] Subjective:  Patient states " I am having some concerns about this wound on my toe. Otherwise I am ok.'  Objective:Kenneth Butleris a 30 y.o.[name], who has a hx of schizoaffective disorder , who [facility] for worsening suicidal thoughts with plan to jump off a bridge.  Patient seen and chart reviewed.Discussed patient with treatment team.  Pt after admission to 400 hall was found to be delusional, paranoid , irritable , refusing medications and very disorganized. Hence patient was started on forced medication order for non emergent medications - per Dr.Cobos who was following patient until 07/20/16 on 400 H as well as Clinical research associatewriter who did the second evaluation on patient yesterday. Currently patient is on [address] and is under writer's care . Pt today continues to present as irritable , disorganized , preoccupied with the wound on his toe from walking a lot prior to admission. O/E - wound on right sided toe- plantar aspect - superficial , there is no discharge , mild tenderness , no swelling or redness noted. Will offer pain management , dressing as needed, topical antibiotics. Conrad NP also to take a look at it.      Principal Problem: Schizoaffective disorder, depressive type (HCC) Diagnosis:   Patient Active Problem List   Diagnosis Date Noted  . Schizoaffective disorder, depressive type (HCC) [F25.1] 07/21/2016   Total Time spent with patient: 25 minutes  Past Psychiatric History: [facility], hx of schizoaffective disorder, used to follow up with [facility], noncompliant with treatment. Please also see H&P.  Past Medical History:  Past Medical History:  Diagnosis Date  . Anxiety   . Depression   . Right clavicle fracture    History reviewed. No pertinent surgical history. Family History: Pt reports he does not have any family, does not know their  hx. Family Psychiatric  History: does not know his family hx. Social History: pt is homeless, please also see H&p. History  Alcohol Use  . Yes    Comment: occasionally     History  Drug Use  . Types: Marijuana    Social History   Social History  . Marital status: Single    Spouse name: N/A  . Number of children: N/A  . Years of education: N/A   Social History Main Topics  . Smoking status: Current Every Day Smoker    Packs/day: 0.50    Types: Cigarettes  . Smokeless tobacco: Never Used  . Alcohol use Yes     Comment: occasionally  . Drug use:     Types: Marijuana  . Sexual activity: No   Other Topics Concern  . None   Social History Narrative  . None   Additional Social History:                         Sleep: restless  Appetite:  Fair  Current Medications: Current Facility-Administered Medications  Medication Dose Route Frequency Provider Last Rate Last Dose  . acetaminophen (TYLENOL) tablet 650 mg  650 mg Oral Q6H PRN Adonis BrookSheila Agustin, NP   650 mg at 07/20/16 1603  . alum & mag hydroxide-simeth (MAALOX/MYLANTA) 200-200-20 MG/5ML suspension 30 mL  30 mL Oral Q4H PRN Adonis BrookSheila Agustin, NP      . bacitracin ointment   Topical TID Jomarie LongsSaramma Dickie Cloe, MD      . benztropine (COGENTIN) tablet  0.5 mg  0.5 mg Oral BID Jomarie LongsSaramma Anniah Glick, MD   0.5 mg at 07/21/16 0802   Or  . benztropine mesylate (COGENTIN) injection 0.5 mg  0.5 mg Intramuscular BID Jomarie LongsSaramma Bradrick Kamau, MD      . divalproex (DEPAKOTE ER) 24 hr tablet 1,000 mg  1,000 mg Oral QHS Rockey SituFernando A Cobos, MD      . haloperidol (HALDOL) tablet 5 mg  5 mg Oral BID Jomarie LongsSaramma Braxen Dobek, MD   5 mg at 07/21/16 0802   Or  . haloperidol lactate (HALDOL) injection 5 mg  5 mg Intramuscular BID Jomarie LongsSaramma Anyi Fels, MD      . LORazepam (ATIVAN) tablet 1 mg  1 mg Oral Q6H PRN Jomarie LongsSaramma Rashaunda Rahl, MD   1 mg at 07/21/16 0802   Or  . LORazepam (ATIVAN) injection 1 mg  1 mg Intramuscular Q6H PRN Jomarie LongsSaramma Kamirah Shugrue, MD      . magnesium hydroxide (MILK OF  MAGNESIA) suspension 30 mL  30 mL Oral Daily PRN Adonis BrookSheila Agustin, NP      . nicotine (NICODERM CQ - dosed in mg/24 hours) patch 21 mg  21 mg Transdermal Daily Craige CottaFernando A Cobos, MD   21 mg at 07/21/16 0802  . OLANZapine (ZYPREXA) tablet 10 mg  10 mg Oral TID PRN Jomarie LongsSaramma Ezri Fanguy, MD   10 mg at 07/20/16 1809   Or  . OLANZapine (ZYPREXA) injection 10 mg  10 mg Intramuscular TID PRN Jomarie LongsSaramma Grayer Sproles, MD      . traZODone (DESYREL) tablet 50 mg  50 mg Oral QHS PRN Adonis BrookSheila Agustin, NP        Lab Results:  Results for orders placed or performed during the hospital encounter of 07/18/16 (from the past 48 hour(s))  Lipid panel     Status: Abnormal   Collection Time: 07/21/16  6:18 AM  Result Value Ref Range   Cholesterol 190 0 - 200 mg/dL   Triglycerides 914137 <782<150 mg/dL   HDL 60 >95>40 mg/dL   Total CHOL/HDL Ratio 3.2 RATIO   VLDL 27 0 - 40 mg/dL   LDL Cholesterol 621103 (H) 0 - 99 mg/dL    Comment:        Total Cholesterol/HDL:CHD Risk Coronary Heart Disease Risk Table                     Men   Women  1/2 Average Risk   3.4   3.3  Average Risk       5.0   4.4  2 X Average Risk   9.6   7.1  3 X Average Risk  23.4   11.0        Use the calculated Patient Ratio above and the CHD Risk Table to determine the patient's CHD Risk.        ATP III CLASSIFICATION (LDL):  <100     mg/dL   Optimal  308-657100-129  mg/dL   Near or Above                    Optimal  130-159  mg/dL   Borderline  846-962160-189  mg/dL   High  >952>190     mg/dL   Very High [facility]formed at [facility]     Blood Alcohol level:  Lab Results  Component Value Date   [facility] <5 07/17/2016   ETH <5 06/23/2016    Metabolic Disorder Labs: No results found for: HGBA1C, MPG No results found for: PROLACTIN Lab Results  Component Value  Date   CHOL 190 07/21/2016   TRIG 137 07/21/2016   HDL 60 07/21/2016   CHOLHDL 3.2 07/21/2016   VLDL 27 07/21/2016   LDLCALC 103 (H) 07/21/2016    Physical Findings: AIMS: Facial and Oral Movements Muscles  of Facial Expression: None, normal Lips and Perioral Area: None, normal Jaw: None, normal Tongue: None, normal,Extremity Movements Upper (arms, wrists, hands, fingers): None, normal Lower (legs, knees, ankles, toes): None, normal, Trunk Movements Neck, shoulders, hips: None, normal, Overall Severity Severity of abnormal movements (highest score from questions above): None, normal Incapacitation due to abnormal movements: None, normal Patient's awareness of abnormal movements (rate only patient's report): No Awareness, Dental Status Current problems with teeth and/or dentures?: No Does patient usually wear dentures?: No  CIWA:  CIWA-Ar Total: 4 COWS:     Musculoskeletal: Strength & Muscle Tone: within normal limits Gait & Station: normal Patient leans: N/A  Psychiatric Specialty Exam: Physical Exam  Nursing note and vitals reviewed.   Review of Systems  Musculoskeletal:       Right sided toe pain  Psychiatric/Behavioral: The patient is nervous/anxious.   All other systems reviewed and are negative.   Blood pressure 133/76, pulse 89, temperature 98.4 F (36.9 C), temperature source Oral, resp. rate 18, height 5\' 3"  (1.6 m), weight 63.5 kg (140 lb).Body mass index is 24.8 kg/m.  General Appearance: Disheveled  Eye Contact:  Good  Speech:  Pressured  Volume:  Increased  Mood:  Irritable  Affect:  Labile  Thought Process:  Disorganized, Irrelevant and Descriptions of Associations: Circumstantial  Orientation:  Other:  place, person  Thought Content:  Delusions, Paranoid Ideation and Rumination  Suicidal Thoughts:  No  Homicidal Thoughts:  No  Memory:  Immediate;   Fair Recent;   Fair Remote;   Fair  Judgement:  Impaired  Insight:  Shallow  Psychomotor Activity:  Restlessness  Concentration:  Concentration: Fair and Attention Span: Fair  Recall:  [employer] of Knowledge:  Fair  Language:  Fair  Akathisia:  No  Handed:  Right  AIMS (if indicated):     Assets:   Physical Health  ADL's:  Intact  Cognition:  WNL  Sleep:  Number of Hours: 5.75     Treatment Plan Summary:Patient transferred to 500 H from 400 H due to disorganized , labile presentation , he is currently on forced medication order - started on 11.16.17 - valid for 7 days . Continue to treat and observe on the unit.  Will continue today 07/21/16  plan as below except where it is noted.  Schizoaffective disorder, depressive type (HCC) Unstable   Daily contact with patient to assess and evaluate symptoms and progress in treatment and Medication management  Reviewed past medical records,treatment plan- I have reviewed Dr.Cobos notes in EHR , also discussed case with him.  For psychosis: Will continue Haldol 5 mg PO/IM bid . Will continue Cogentin 0.5 mg PO/IM for EPS.  For mood lability: Will continue Depakote ER 1000 mg po qhs . Depakote level ordered for 07/24/16 - Monday.  For insomnia: Pt offered scheduled medication, pt refuses. Will continue Trazodone 50 mg po qhs prn.  For agitation/anxiety: Will continue PRN medications as per Unit protocol.  For wound on right toe: Will start dressing. Will start Bacitracin topical tid. Mr. Renata Caprice Np to examine patient further.  Will continue to monitor vitals ,medication compliance and treatment side effects while patient is here.   Will monitor for medical issues as well as call  consult as needed.   Reviewed labs lipid panel - wnl, tsh - wnl  ,pending hba1c, pl.  Ordered EKG for qtc - patient refused - will re order.  CSW will continue working on disposition.   Patient to participate in therapeutic milieu .       Donyea Gafford, MD 07/21/2016, 11:23 AM

## 2016-07-22 DIAGNOSIS — Z79899 Other long term (current) drug therapy: Secondary | ICD-10-CM

## 2016-07-22 DIAGNOSIS — F17210 Nicotine dependence, cigarettes, uncomplicated: Secondary | ICD-10-CM

## 2016-07-22 DIAGNOSIS — F251 Schizoaffective disorder, depressive type: Principal | ICD-10-CM

## 2016-07-22 LAB — HEMOGLOBIN A1C
Hgb A1c MFr Bld: 4.6 % — ABNORMAL LOW (ref 4.8–5.6)
Mean Plasma Glucose: 85 mg/dL

## 2016-07-22 LAB — PROLACTIN: Prolactin: 17.4 ng/mL — ABNORMAL HIGH (ref 4.0–15.2)

## 2016-07-22 NOTE — Progress Notes (Signed)
DAR NOTE: Patient presents with flat, blunt affect and depressed mood.  Denies pain, auditory and visual hallucinations.  Rates depression at 0, hopelessness at 0, and anxiety at 0.  Maintained on routine safety checks.  Medications given as prescribed.  Support and encouragement offered as needed.  Attended group and participated.  States goal for today is "to be allowed off the unit."  Patient remained isolative and withdrawn.  Minimal interaction with staff.  Dressing to right great toe changed.  No signs of infection noted.  Patient remained on unit restriction.

## 2016-07-22 NOTE — [facility] Group Notes (Signed)
[facility] Group Notes: (Clinical Social Work)   07/22/2016      Type of Therapy:  Group Therapy   Participation Level:  Did Not Attend despite MHT prompting   Ambrose MantleMareida Grossman-Orr, LCSW 07/22/2016, 12:24 PM

## 2016-07-22 NOTE — Progress Notes (Signed)
[facility]  07/22/2016 3:11 PM Talitha GivensDeshea Knoth  MRN:  [id number] Subjective:  Patient states that he is ok.  He smiled.  He is maintained that he will refuse the 1000 Depakote.  He only agrees to 500 mg.    Objective:Cliff Butleris a 30 y.o.[name], who has a hx of schizoaffective disorder , [facility] worsening [facility] thoughts with plan to jump off a bridge.  Patient seen and chart reviewed.Discussed patient with treatment team.  Pt after admission to 400 hall was found to be delusional, paranoid , irritable , refusing medications and very disorganized. Hence patient was started on forced medication order for non emergent medications - per Dr.Cobos who was following patient until 07/20/16 on 400 H as well as Clinical research associatewriter who did the second evaluation on patient yesterday. Per nursing, patient is compliant with meds but refusing to take the 1000 mg Depakote, will only take 500 mg.  He reports that when he took the 1000 mg before he became a "zombie, and started trashing the hotel" where he was staying at. There are no reports of disruptive behavior.   Pt today continues to be disorganized, labile. PRN meds made available to  offer pain management , dressing as needed, topical antibiotics on foot wound.  Principal Problem: Schizoaffective disorder, depressive type (HCC) Diagnosis:   Patient Active Problem List   Diagnosis Date Noted  . Schizoaffective disorder, depressive type (HCC) [F25.1] 07/21/2016   Total Time spent with patient: 25 minutes  Past Psychiatric History: [facility], hx of schizoaffective disorder, used to follow up with [facility], noncompliant with treatment. Please also see H&P.  Past Medical History:  Past Medical History:  Diagnosis Date  . Anxiety   . Depression   . Right clavicle fracture    History reviewed. No pertinent surgical history. Family History: Pt reports he does not have any family,  does not know their hx. Family Psychiatric  History: does not know his family hx. Social History: pt is homeless, please also see H&p. History  Alcohol Use  . Yes    Comment: occasionally     History  Drug Use  . Types: Marijuana    Social History   Social History  . Marital status: Single    Spouse name: N/A  . Number of children: N/A  . Years of education: N/A   Social History Main Topics  . Smoking status: Current Every Day Smoker    Packs/day: 0.50    Types: Cigarettes  . Smokeless tobacco: Never Used  . Alcohol use Yes     Comment: occasionally  . Drug use:     Types: Marijuana  . Sexual activity: No   Other Topics Concern  . None   Social History Narrative  . None   Additional Social History:      Sleep: restless  Appetite:  Fair  Current Medications: Current Facility-Administered Medications  Medication Dose Route Frequency Provider Last Rate Last Dose  . acetaminophen (TYLENOL) tablet 650 mg  650 mg Oral Q6H PRN Adonis BrookSheila Agustin, NP   650 mg at 07/20/16 1603  . alum & mag hydroxide-simeth (MAALOX/MYLANTA) 200-200-20 MG/5ML suspension 30 mL  30 mL Oral Q4H PRN Adonis BrookSheila Agustin, NP      . bacitracin ointment   Topical TID Jomarie LongsSaramma Eappen, MD      . benztropine (COGENTIN) tablet 0.5 mg  0.5 mg Oral BID Jomarie LongsSaramma Eappen, MD   0.5  mg at 07/22/16 1031   Or  . benztropine mesylate (COGENTIN) injection 0.5 mg  0.5 mg Intramuscular BID Jomarie Longs, MD      . divalproex (DEPAKOTE ER) 24 hr tablet 1,000 mg  1,000 mg Oral QHS Rockey Situ Cobos, MD      . haloperidol (HALDOL) tablet 5 mg  5 mg Oral BID Jomarie Longs, MD   5 mg at 07/22/16 1032   Or  . haloperidol lactate (HALDOL) injection 5 mg  5 mg Intramuscular BID Jomarie Longs, MD      . LORazepam (ATIVAN) tablet 1 mg  1 mg Oral Q6H PRN Jomarie Longs, MD   1 mg at 07/21/16 0802   Or  . LORazepam (ATIVAN) injection 1 mg  1 mg Intramuscular Q6H PRN Jomarie Longs, MD      . magnesium hydroxide (MILK OF  MAGNESIA) suspension 30 mL  30 mL Oral Daily PRN Adonis Brook, NP      . nicotine (NICODERM CQ - dosed in mg/24 hours) patch 21 mg  21 mg Transdermal Daily Craige Cotta, MD   21 mg at 07/21/16 0802  . OLANZapine (ZYPREXA) tablet 10 mg  10 mg Oral TID PRN Jomarie Longs, MD   10 mg at 07/20/16 1809   Or  . OLANZapine (ZYPREXA) injection 10 mg  10 mg Intramuscular TID PRN Jomarie Longs, MD      . traZODone (DESYREL) tablet 50 mg  50 mg Oral QHS PRN Adonis Brook, NP        Lab Results:  Results for orders placed or performed during the hospital encounter of 07/18/16 (from the past 48 hour(s))  Lipid panel     Status: Abnormal   Collection Time: 07/21/16  6:18 AM  Result Value Ref Range   Cholesterol 190 0 - 200 mg/dL   Triglycerides 161 <096 mg/dL   HDL 60 >04 mg/dL   Total CHOL/HDL Ratio 3.2 RATIO   VLDL 27 0 - 40 mg/dL   LDL Cholesterol 540 (H) 0 - 99 mg/dL    Comment:        Total Cholesterol/HDL:CHD Risk Coronary Heart Disease Risk Table                     Men   Women  1/2 Average Risk   3.4   3.3  Average Risk       5.0   4.4  2 X Average Risk   9.6   7.1  3 X Average Risk  23.4   11.0        Use the calculated Patient Ratio above and the CHD Risk Table to determine the patient's CHD Risk.        ATP III CLASSIFICATION (LDL):  <100     mg/dL   Optimal  981-191  mg/dL   Near or Above                    Optimal  130-159  mg/dL   Borderline  478-295  mg/dL   High  >621     mg/dL   Very High Performed at [facility]   Hemoglobin A1c     Status: Abnormal   Collection Time: 07/21/16  6:18 AM  Result Value Ref Range   Hgb A1c MFr Bld 4.6 (L) 4.8 - 5.6 %    Comment: (NOTE)         Pre-diabetes: 5.7 - 6.4  Diabetes: >6.4         Glycemic control for adults with diabetes: <7.0    Mean Plasma Glucose 85 mg/dL    Comment: (NOTE) Performed At: [facility] [address] [place], [place] [id number] Mila HomerHancock William F MD  WU:[id number]:[phone] Performed at [facility]   Prolactin     Status: Abnormal   Collection Time: 07/21/16  6:18 AM  Result Value Ref Range   Prolactin 17.4 (H) 4.0 - 15.2 ng/mL    Comment: (NOTE) Performed At: [facility] [address] [place], [place] [id number] Mila HomerHancock William F MD VH:[id number]:[phone] Performed at [facility]     Blood Alcohol level:  Lab Results  Component Value Date   [facility] <5 07/17/2016   ETH <5 06/23/2016    Metabolic Disorder Labs: Lab Results  Component Value Date   HGBA1C 4.6 (L) 07/21/2016   MPG 85 07/21/2016   Lab Results  Component Value Date   PROLACTIN 17.4 (H) 07/21/2016   Lab Results  Component Value Date   CHOL 190 07/21/2016   TRIG 137 07/21/2016   HDL 60 07/21/2016   CHOLHDL 3.2 07/21/2016   VLDL 27 07/21/2016   LDLCALC 103 (H) 07/21/2016    Physical Findings: AIMS: Facial and Oral Movements Muscles of Facial Expression: None, normal Lips and Perioral Area: None, normal Jaw: None, normal Tongue: None, normal,Extremity Movements Upper (arms, wrists, hands, fingers): None, normal Lower (legs, knees, ankles, toes): None, normal, Trunk Movements Neck, shoulders, hips: None, normal, Overall Severity Severity of abnormal movements (highest score from questions above): None, normal Incapacitation due to abnormal movements: None, normal Patient's awareness of abnormal movements (rate only patient's report): No Awareness, Dental Status Current problems with teeth and/or dentures?: No Does patient usually wear dentures?: No  CIWA:  CIWA-Ar Total: 4 COWS:     Musculoskeletal: Strength & Muscle Tone: within normal limits Gait & Station: normal Patient leans: N/A  Psychiatric Specialty Exam: Physical Exam  Nursing note and vitals reviewed. Skin:       Review of Systems  Musculoskeletal:       Right sided toe pain  Skin:       Wound to toe  Psychiatric/Behavioral: The patient is  nervous/anxious.   All other systems reviewed and are negative.   Blood pressure 133/76, pulse 89, temperature 98.4 F (36.9 C), temperature source Oral, resp. rate 18, height 5\' 3"  (1.6 m), weight 63.5 kg (140 lb).Body mass index is 24.8 kg/m.  General Appearance: Disheveled  Eye Contact:  Good  Speech:  Pressured  Volume:  Increased  Mood:  Irritable  Affect:  Labile  Thought Process:  Disorganized, Irrelevant and Descriptions of Associations: Circumstantial  Orientation:  Other:  place, person  Thought Content:  Delusions, Paranoid Ideation and Rumination  Suicidal Thoughts:  No  Homicidal Thoughts:  No  Memory:  Immediate;   Fair Recent;   Fair Remote;   Fair  Judgement:  Impaired  Insight:  Shallow  Psychomotor Activity:  Restlessness  Concentration:  Concentration: Fair and Attention Span: Fair  Recall:  [employer]  Fund of Knowledge:  Fair  Language:  Fair  Akathisia:  No  Handed:  Right  AIMS (if indicated):     Assets:  Physical Health  ADL's:  Intact  Cognition:  WNL  Sleep:  Number of Hours: 6.75   Treatment Plan Summary:Patient transferred to 500 H from 400 H due to disorganized , labile presentation , he is currently on forced medication  order - started on 11.16.17 - valid for 7 days . Continue to treat and observe on the unit.  Will continue today 07/22/16  plan as below except where it is noted.  Schizoaffective disorder, depressive type (HCC) Unstable   Daily contact with patient to assess and evaluate symptoms and progress in treatment and Medication management  Reviewed past medical records,treatment plan- I have reviewed Dr.Cobos notes in EHR , also discussed case with him.  For psychosis: Will continue Haldol 5 mg PO/IM bid . Will continue Cogentin 0.5 mg PO/IM for EPS.  For mood lability: Will continue Depakote ER 1000 mg po qhs.  Patient refusing to take full dose.  Taking 500 mg. Depakote level ordered for 07/24/16 - Monday.  For insomnia: Pt  offered scheduled medication, pt refuses. Will continue Trazodone 50 mg po qhs prn.  For agitation/anxiety: Will continue PRN medications as per Unit protocol.  For wound on right toe: Will start dressing. Will start Bacitracin topical tid. Continue treatment and will cont to monitor.  Will continue to monitor vitals ,medication compliance and treatment side effects while patient is here.   Will monitor for medical issues as well as call consult as needed.   Reviewed labs lipid panel - wnl, TSH- wnl, 4.6 HgB A1C.  PL 17.4  Ordered EKG for qtc - patient refused - will re order.  CSW will continue working on disposition.   Patient to participate in therapeutic milieu .   Lindwood Qua, NP [facility] 07/22/2016, 3:11 PM  Agree with NP Progress Note as above

## 2016-07-23 MED ORDER — DIVALPROEX SODIUM ER 500 MG PO TB24
500.0000 mg | ORAL_TABLET | Freq: Once | ORAL | Status: AC
  Administered 2016-07-23: 500 mg via ORAL

## 2016-07-23 NOTE — Progress Notes (Signed)
D.  Pt went to bed early but got up around midnight to take his medication.  Pt refused to take 1000 of depakote, but agreed to take 500 mg.  Pt states he is not refusing, but wants to talk to doctor tomorrow about dosage.  Pt very loud when speaking.  A Pt given dose he would take and encouraged to speak with doctor tomorrow.  Barbara CowerJason, NP notified of what had transpired and one time order put in for Depakote 500 mg.  R.  Pt remains safe on unit, will continue to monitor.

## 2016-07-23 NOTE — [facility] Group Notes (Signed)
[facility] Group Notes: (Clinical Social Work)   07/23/2016      Type of Therapy:  Group Therapy   Participation Level:  Did Not Attend despite MHT prompting   Rosalynn Sergent Grossman-Orr, LCSW 07/23/2016, 1:26 PM     

## 2016-07-23 NOTE — Progress Notes (Signed)
D.  Pt pleasant but guarded on approach.  Intense gaze.  Pt was positive for evening wrap up group, minimal interaction on unit.  Pt continues to take 500 mg of 1000 mg Depakote ordered.  Pt feels that 1000 mg is too sedating and states that he had an adverse reaction to it in the past.  Pt denies SI/HI/hallucinations at this time.  A.  Support and encouragement offered, encouraged Pt to speak with doctor about dosage and his reasons for not wanting to take full dose.  R.  Pt remains safe on the unit, will continue to monitor.

## 2016-07-23 NOTE — Progress Notes (Signed)
Adult Psychoeducational Group Note  Date:  07/23/2016 Time:  9:11 PM  Group Topic/Focus:  Wrap-Up Group:   The focus of this group is to help patients review their daily goal of treatment and discuss progress on daily workbooks.   Participation Level:  Minimal  Participation Quality:  Appropriate  Affect:  Flat  Cognitive:  Appropriate  Insight: Appropriate  Engagement in Group:  Limited  Modes of Intervention:  Discussion  Additional Comments:  Pt stated that he had a good day. He attended group and took his medications.  Kaleen OdeaCOOKE, Hafsah Hendler R 07/23/2016, 9:11 PM

## 2016-07-23 NOTE — [facility] Group Notes (Signed)
Adult Psychoeducational Group Note  Date:  07/23/2016 Time:  1000-1030  Group Topic/Focus:  Healthy Support Systems & Check-In Group During this group we asked patients to list their goal and a healthy support person.  At the end of the group education was given on the importance of sleep hygiene, medication compliance, and openness to staff about feelings and safety concerns.  Participation Level:  Active  Participation Quality: Minimal  Affect:  Flat  Cognitive:  Alert  Insight: Limited  Engagement in Group:  Engaged but minimal  Modes of Intervention:  Discussion and Education  Additional Comments:  Patient attended for duration of group.  Minimal but did share 1 goal and 1 healthy support person whom he does not have contact with anymore.  [name] 07/23/2016, 12:48 PM

## 2016-07-23 NOTE — Progress Notes (Signed)
Nursing Note 07/23/2016 [id number]  Data Reports sleeping fair with/without PRN sleep med.  Refused to complete self-inventory.   Affect flat.  Denies HI, SI, AVH.  Patient calm and appropriate in AM, minimal and forwards little, but courteous with staff.  Was not observed interacting with peers.  Seen in morning standing in front of TV laughing inappropriately.  Asked to speak with provider.  When provider spoke with patient, patient very focused on Depakote stating "I don't know why y'all put me on 1000mg "- he only took 500mg  last night "Last time I took 1000mg  I couldn't move, and when I came to I tore up the hotel room."  He got very agitated and yelled at provider when it was suggested to try maybe 750mg  of depakote.  Provider did rest on the official recommendation to take 1000mg  tonight.  Patient agreed to take only 500mg  again tonight and did calm down by the end of the conversation.  Going to meals and groups, withdrawn in milieu.  Right great toe cut healing well, continues to receive bacitracin ointment to cut.  No redness swelling or edema observed.  Action Spoke with patient 1:1, nurse offered support to patient throughout shift.  Continues to be monitored on 15 minute checks for safety.  Response Remains safe on unit, a little irritable when speaking with provider.

## 2016-07-24 LAB — VALPROIC ACID LEVEL: VALPROIC ACID LVL: 36 ug/mL — AB (ref 50.0–100.0)

## 2016-07-24 MED ORDER — DIVALPROEX SODIUM ER 500 MG PO TB24
750.0000 mg | ORAL_TABLET | Freq: Every day | ORAL | Status: DC
  Administered 2016-07-24 – 2016-07-27 (×4): 750 mg via ORAL
  Filled 2016-07-24 (×7): qty 1

## 2016-07-24 MED ORDER — HALOPERIDOL DECANOATE 100 MG/ML IM SOLN
100.0000 mg | INTRAMUSCULAR | Status: DC
  Administered 2016-07-24: 100 mg via INTRAMUSCULAR
  Filled 2016-07-24: qty 1

## 2016-07-24 NOTE — Tx Team (Signed)
Interdisciplinary Treatment and Diagnostic Plan Update  07/24/2016 Time of Session: 8:45 AM  [name] MRN: [id number]  Principal Diagnosis: Schizoaffective disorder, depressive type ([place])  Secondary Diagnoses: Principal Problem:   Schizoaffective disorder, depressive type ([place])   Current Medications:  Current Facility-Administered Medications  Medication Dose Route Frequency Provider Last Rate Last Dose  . acetaminophen (TYLENOL) tablet 650 mg  650 mg Oral Q6H PRN Kerrie Buffalo, NP   650 mg at 07/20/16 1603  . alum & mag hydroxide-simeth (MAALOX/MYLANTA) 200-200-20 MG/5ML suspension 30 mL  30 mL Oral Q4H PRN Kerrie Buffalo, NP      . bacitracin ointment   Topical TID Ursula Alert, MD      . benztropine (COGENTIN) tablet 0.5 mg  0.5 mg Oral BID Ursula Alert, MD   0.5 mg at 07/24/16 7353   Or  . benztropine mesylate (COGENTIN) injection 0.5 mg  0.5 mg Intramuscular BID Ursula Alert, MD      . divalproex (DEPAKOTE ER) 24 hr tablet 1,000 mg  1,000 mg Oral QHS Myer Peer Cobos, MD   500 mg at 07/23/16 2051  . haloperidol (HALDOL) tablet 5 mg  5 mg Oral BID Ursula Alert, MD   5 mg at 07/24/16 2992   Or  . haloperidol lactate (HALDOL) injection 5 mg  5 mg Intramuscular BID Ursula Alert, MD      . LORazepam (ATIVAN) tablet 1 mg  1 mg Oral Q6H PRN Ursula Alert, MD   1 mg at 07/21/16 0802   Or  . LORazepam (ATIVAN) injection 1 mg  1 mg Intramuscular Q6H PRN Saramma Eappen, MD      . magnesium hydroxide (MILK OF MAGNESIA) suspension 30 mL  30 mL Oral Daily PRN Kerrie Buffalo, NP      . OLANZapine (ZYPREXA) tablet 10 mg  10 mg Oral TID PRN Ursula Alert, MD   10 mg at 07/20/16 1809   Or  . OLANZapine (ZYPREXA) injection 10 mg  10 mg Intramuscular TID PRN Ursula Alert, MD      . traZODone (DESYREL) tablet 50 mg  50 mg Oral QHS PRN Kerrie Buffalo, NP        PTA Medications: Prescriptions Prior to Admission  Medication Sig Dispense Refill Last Dose  . risperiDONE  (RISPERDAL) 3 MG tablet Take 1 tablet (3 mg total) by mouth at bedtime. For mood stabilization. (Patient not taking: Reported on 07/17/2016) 30 tablet 0 Not Taking at Unknown time    Treatment Modalities: Medication Management, Group therapy, Case management,  1 to 1 session with clinician, Psychoeducation, Recreational therapy.  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  Physician Treatment Plan for Primary Diagnosis: Schizoaffective disorder, depressive type ([place]) Long Term Goal(s): Improvement in symptoms so as ready for discharge  Short Term Goals: Ability to identify changes in lifestyle to reduce recurrence of condition will improve Ability to verbalize feelings will improve Ability to disclose and discuss suicidal ideas Ability to demonstrate self-control will improve Ability to identify and develop effective coping behaviors will improve Ability to maintain clinical measurements within normal limits will improve Compliance with prescribed medications will improve Ability to identify triggers associated with substance abuse/mental health issues will improve Ability to identify changes in lifestyle to reduce recurrence of condition will improve Ability to verbalize feelings will improve Ability to disclose and discuss suicidal ideas Ability to demonstrate self-control will improve Ability to identify and develop effective coping  behaviors will improve Ability to maintain clinical measurements within normal limits will improve Compliance with prescribed medications will improve Ability to identify triggers associated with substance abuse/mental health issues will improve  Medication Management: Evaluate patient's response, side effects, and tolerance of medication regimen.  Therapeutic Interventions: 1 to 1 sessions, Unit Group  sessions and Medication administration.  Evaluation of Outcomes: Progressing  Physician Treatment Plan for Secondary Diagnosis: Principal Problem:   Schizoaffective disorder, depressive type ([place])   Long Term Goal(s): Improvement in symptoms so as ready for discharge  Short Term Goals: Ability to identify changes in lifestyle to reduce recurrence of condition will improve Ability to verbalize feelings will improve Ability to disclose and discuss suicidal ideas Ability to demonstrate self-control will improve Ability to identify and develop effective coping behaviors will improve Ability to maintain clinical measurements within normal limits will improve Compliance with prescribed medications will improve Ability to identify triggers associated with substance abuse/mental health issues will improve Ability to identify changes in lifestyle to reduce recurrence of condition will improve Ability to verbalize feelings will improve Ability to disclose and discuss suicidal ideas Ability to demonstrate self-control will improve Ability to identify and develop effective coping behaviors will improve Ability to maintain clinical measurements within normal limits will improve Compliance with prescribed medications will improve Ability to identify triggers associated with substance abuse/mental health issues will improve  Medication Management: Evaluate patient's response, side effects, and tolerance of medication regimen.  Therapeutic Interventions: 1 to 1 sessions, Unit Group sessions and Medication administration.  Evaluation of Outcomes: Progressing   RN Treatment Plan for Primary Diagnosis: Schizoaffective disorder, depressive type ([place]) Long Term Goal(s): Knowledge of disease and therapeutic regimen to maintain health will improve  Short Term Goals: Ability to verbalize feelings will improve, Ability to disclose and discuss suicidal ideas and Ability to identify and develop effective  coping behaviors will improve  Medication Management: RN will administer medications as ordered by provider, will assess and evaluate patient's response and provide education to patient for prescribed medication. RN will report any adverse and/or side effects to prescribing provider.  Therapeutic Interventions: 1 on 1 counseling sessions, Psychoeducation, Medication administration, Evaluate responses to treatment, Monitor vital signs and CBGs as ordered, Perform/monitor CIWA, COWS, AIMS and Fall Risk screenings as ordered, Perform wound care treatments as ordered.  Evaluation of Outcomes: Progressing   LCSW Treatment Plan for Primary Diagnosis: Schizoaffective disorder, depressive type ( [place]) Long Term Goal(s): Safe transition to appropriate next level of care at discharge, Engage patient in therapeutic group addressing interpersonal concerns.  Short Term Goals: Engage patient in aftercare planning with referrals and resources, Increase emotional regulation, Facilitate acceptance of mental health diagnosis and concerns and Increase skills for wellness and recovery  Therapeutic Interventions: Assess for all discharge needs, 1 to 1 time with Social worker, Explore available resources and support systems, Assess for adequacy in community support network, Educate family and significant other(s) on suicide prevention, Complete Psychosocial Assessment, Interpersonal group therapy.  Evaluation of Outcomes: Met   Progress in Treatment: Attending groups: Yes Participating in groups: Yes Taking medication as prescribed: Yes, MD continues to assess for medication changes as needed Toleration medication: Yes, no side effects reported at this time Family/Significant other contact made:Yes Patient understands diagnosis: No  Limited insight Discussing patient identified problems/goals with staff: Yes Medical problems stabilized or resolved: Yes Denies suicidal/homicidal ideation: Yes Issues/concerns per  patient self-inventory: None Other: N/A  New problem(s) identified: None identified at this time.   New Short Term/Long  Term Goal(s): None identified at this time.   Discharge Plan or Barriers: CSW will assess for appropriate discharge plan and relevant barriers.   Reason for Continuation of Hospitalization:  Medication stabilization   Estimated Length of Stay: 1-3 days  Attendees: Patient: 07/24/2016  8:45 AM  Physician: Ursula Alert 07/24/2016  8:45 AM  Nursing: Eulogio Bear, RN; Loletta Specter, RN 07/24/2016  8:45 AM  RN Care Manager: Lars Pinks, RN 07/24/2016  8:45 AM  Social Worker:  Ripley Fraise, LCSW 07/24/2016  8:45 AM  Recreational Therapist:  07/24/2016  8:45 AM  Other:  NP 07/24/2016  8:45 AM  Other:  07/24/2016  8:45 AM  Other: 07/24/2016  8:45 AM    Scribe for Treatment Team: Ripley Fraise, LCSW 07/24/2016 8:45 AM

## 2016-07-24 NOTE — Plan of Care (Signed)
Problem: Activity: Goal: Interest or engagement in activities will improve Outcome: Progressing Pt did attend evening wrap up group   

## 2016-07-24 NOTE — Progress Notes (Signed)
Patient reports a reduction in psychotic symptoms and acknowledges the need for psychiatric medications. Patient denies SI and HI and has been compliant with medications.   Assess patient for safety, offer medications as prescribed, continue to engage patient in 1:1 staff talks.   Continue to monitor patient for safety.

## 2016-07-24 NOTE — [facility] Group Notes (Signed)
[facility] LCSW Group Therapy  07/24/2016 1:15 pm  Type of Therapy: Process Group Therapy  Participation Level:  Active  Participation Quality:  Appropriate  Affect:  Flat  Cognitive:  Oriented  Insight:  Improving  Engagement in Group:  Limited  Engagement in Therapy:  Limited  Modes of Intervention:  Activity, Clarification, Education, Problem-solving and Support  Summary of Progress/Problems: Today's group addressed the issue of overcoming obstacles.  Patients were asked to identify their biggest obstacle post d/c that stands in the way of their on-going success, and then problem solve as to how to manage this. Stayed the entire time, engaged throughout.  Stated he has no obstacles in particular that he wants to talk about, but did share that he has a former group home worker with whom he stays in contact.  "I call him when I need to talk to someone.  I called him since I have been here.  He gives me good advice."  Ida Rogueorth, Riham Polyakov B 07/24/2016   3:38 PM

## 2016-07-24 NOTE — Progress Notes (Signed)
Patient ID: Kenneth GivensDeshea Robinson, male   DOB: [birth year], 30 y.o.   MRN: [id number] D: Client in bed this shift, isolates. Client answers questions but forwards little. A: Writer continued to initiated interaction, medications reviewed administered as ordered "my doctor didn't tell me nothing about no sleeping medications" Writer informed Trazodone was ordered as need for sleep. Staff will monitor q6415min for safety. R: client is safe on the unit, did not attend group. Client refused trazodone.

## 2016-07-24 NOTE — Progress Notes (Signed)
Recreation Therapy Notes  Date: 07/24/16 Time: 1000 Location: 500 Hall Dayroom  Group Topic: Self-Esteem  Goal Area(s) Addresses:  Patient will identify positive ways to increase self-esteem. Patient will verbalize benefit of increased self-esteem.  Intervention: Worksheets with blank faces, markers, colored pencils  Activity: How I See Me.  LRT introduced the concept of self-esteem and why it's important.  Patients were given a worksheet with a blank face on it.  Patients were asked to draw or use words to describe how they see themselves on the blank face.  Patients were then asked to draw/write how other people see them on the back of the worksheet.  Education:  Self-Esteem, Building control surveyorDischarge Planning.   Education Outcome: Acknowledges education/In group clarification offered/Needs additional education  Clinical Observations/Feedback: Pt did not attend group.    Caroll RancherMarjette Tavaughn Silguero, LRT/CTRS         Caroll RancherLindsay, Wei Newbrough A 07/24/2016 12:41 PM

## 2016-07-24 NOTE — Progress Notes (Signed)
Did not attend group 

## 2016-07-24 NOTE — Progress Notes (Signed)
[facility] MD Progress Note  07/24/2016 1:58 PM [name]  MRN:  [id number] Subjective:  Patient states " I am not taking the high dose of depakote , I had this incident when I was kind of paralysed in my sleep. I am fine with taking just 500 mg."   Objective:Kenneth Butleris a 30 y.o.[name], who has a hx of schizoaffective disorder , who [facility] for worsening suicidal thoughts with plan to jump off a bridge.  Patient seen and chart reviewed.Discussed patient with treatment team.  Pt recently transferred to [address] H from [address] due to disorganized behavior. Pt currently on forced medication order. Pt seen as withdrawn to his room today , continues to be vaguely irritable - but overall making progress. Pt per staff - is more redirectable and less impulsive. Pt has been taking his haldol with no ADRs , however refuses to take his depakote at the 1000 mg dose. Pt had depakote level done this AM - which was subtherapeutic . Pt provided with reassurance- medication education provided - discussed to take 750 mg tonight . Pt voiced understanding.    Principal Problem: Schizoaffective disorder, depressive type (HCC) Diagnosis:   Patient Active Problem List   Diagnosis Date Noted  . Schizoaffective disorder, depressive type (HCC) [F25.1] 07/21/2016   Total Time spent with patient: 25 minutes  Past Psychiatric History: Patient with several admissions to [facility] in the past, hx of schizoaffective disorder, used to follow up with [facility], noncompliant with treatment. Please also see H&P.  Past Medical History:  Past Medical History:  Diagnosis Date  . Anxiety   . Depression   . Right clavicle fracture    History reviewed. No pertinent surgical history. Family History: Pt reports he does not have any family, does not know their hx. Family Psychiatric  History: does not know his family hx. Social History: pt is homeless, please also see H&p. History  Alcohol Use  . Yes   Comment: occasionally     History  Drug Use  . Types: Marijuana    Social History   Social History  . Marital status: Single    Spouse name: N/A  . Number of children: N/A  . Years of education: N/A   Social History Main Topics  . Smoking status: Current Every Day Smoker    Packs/day: 0.50    Types: Cigarettes  . Smokeless tobacco: Never Used  . Alcohol use Yes     Comment: occasionally  . Drug use:     Types: Marijuana  . Sexual activity: No   Other Topics Concern  . None   Social History Narrative  . None   Additional Social History:                         Sleep: Fair  Appetite:  Fair  Current Medications: Current Facility-Administered Medications  Medication Dose Route Frequency Provider Last Rate Last Dose  . acetaminophen (TYLENOL) tablet 650 mg  650 mg Oral Q6H PRN Adonis Brook, NP   650 mg at 07/20/16 1603  . alum & mag hydroxide-simeth (MAALOX/MYLANTA) 200-200-20 MG/5ML suspension 30 mL  30 mL Oral Q4H PRN Adonis Brook, NP      . bacitracin ointment   Topical TID Jomarie Longs, MD      . benztropine (COGENTIN) tablet 0.5 mg  0.5 mg Oral BID Jomarie Longs, MD   0.5 mg at 07/24/16 0824   Or  . benztropine mesylate (  COGENTIN) injection 0.5 mg  0.5 mg Intramuscular BID Jomarie LongsSaramma Nikash Mortensen, MD      . divalproex (DEPAKOTE ER) 24 hr tablet 750 mg  750 mg Oral QHS Hudsyn Champine, MD      . haloperidol (HALDOL) tablet 5 mg  5 mg Oral BID Jomarie LongsSaramma Yamen Castrogiovanni, MD   5 mg at 07/24/16 [id number]   Or  . haloperidol lactate (HALDOL) injection 5 mg  5 mg Intramuscular BID Royelle Hinchman, MD      . haloperidol decanoate (HALDOL DECANOATE) 100 MG/ML injection 100 mg  100 mg Intramuscular Q30 days Jomarie LongsSaramma Analysse Quinonez, MD      . LORazepam (ATIVAN) tablet 1 mg  1 mg Oral Q6H PRN Jomarie LongsSaramma Debbora Ang, MD   1 mg at 07/21/16 0802   Or  . LORazepam (ATIVAN) injection 1 mg  1 mg Intramuscular Q6H PRN Maurissa Ambrose, MD      . magnesium hydroxide (MILK OF MAGNESIA) suspension 30 mL  30 mL  Oral Daily PRN Adonis BrookSheila Agustin, NP      . OLANZapine (ZYPREXA) tablet 10 mg  10 mg Oral TID PRN Jomarie LongsSaramma Lemonte Al, MD   10 mg at 07/20/16 1809   Or  . OLANZapine (ZYPREXA) injection 10 mg  10 mg Intramuscular TID PRN Jomarie LongsSaramma Colene Mines, MD      . traZODone (DESYREL) tablet 50 mg  50 mg Oral QHS PRN Adonis BrookSheila Agustin, NP        Lab Results:  Results for orders placed or performed during the hospital encounter of 07/18/16 (from the past 48 hour(s))  Valproic acid level     Status: Abnormal   Collection Time: 07/24/16  6:15 AM  Result Value Ref Range   Valproic Acid Lvl 36 (L) 50.0 - 100.0 ug/mL    Comment[facility]    Blood Alcohol level:  Lab Results  Component Value Date   [facility] <[facility] 07/17/2016   ETH <5 06/23/2016    Metabolic Disorder Labs: Lab Results  Component Value Date   HGBA1C 4.6 (L) 07/21/2016   MPG 85 07/21/2016   Lab Results  Component Value Date   PROLACTIN 17.4 (H) 07/21/2016   Lab Results  Component Value Date   CHOL 190 07/21/2016   TRIG 137 07/21/2016   HDL 60 07/21/2016   CHOLHDL 3.2 07/21/2016   VLDL 27 07/21/2016   LDLCALC 103 (H) 07/21/2016    Physical Findings: AIMS: Facial and Oral Movements Muscles of Facial Expression: None, normal Lips and Perioral Area: None, normal Jaw: None, normal Tongue: None, normal,Extremity Movements Upper (arms, wrists, hands, fingers): None, normal Lower (legs, knees, ankles, toes): None, normal, Trunk Movements Neck, shoulders, hips: None, normal, Overall Severity Severity of abnormal movements (highest score from questions above): None, normal Incapacitation due to abnormal movements: None, normal Patient's awareness of abnormal movements (rate only patient's report): No Awareness, Dental Status Current problems with teeth and/or dentures?: No Does patient usually wear dentures?: No  CIWA:  CIWA-Ar Total: 4 COWS:     Musculoskeletal: Strength & Muscle Tone: within normal limits Gait  & Station: normal Patient leans: N/A  Psychiatric Specialty Exam: Physical Exam  Nursing note and vitals reviewed.   Review of Systems  Musculoskeletal:       Right sided toe pain  Psychiatric/Behavioral: The patient is nervous/anxious.   All other systems reviewed and are negative.   Blood pressure 126/86, pulse 77, temperature 99.3 F (37.4 C), temperature source Oral, resp. rate 18, height 5\' 3"  (1.6 m), weight  63.5 kg (140 lb).Body mass index is 24.8 kg/m.  General Appearance: Fairly Groomed  Eye Contact:  Good  Speech:  Pressured  Volume:  Increased  Mood:  Irritable  Affect:  Labile  Thought Process:  Descriptions of Associations: Circumstantial more organized   Orientation:  Other:  place, person  Thought Content:  Delusions, Paranoid Ideation and Rumination not too preoccupied with delusions , continues to be suspicious  Suicidal Thoughts:  No  Homicidal Thoughts:  No  Memory:  Immediate;   Fair Recent;   Fair Remote;   Fair  Judgement:  Impaired  Insight:  Shallow  Psychomotor Activity:  Restlessness  Concentration:  Concentration: Fair and Attention Span: Fair  Recall:  FiservFair  Fund of Knowledge:  Fair  Language:  Fair  Akathisia:  No  Handed:  Right  AIMS (if indicated):     Assets:  Physical Health  ADL's:  Intact  Cognition:  WNL  Sleep:  Number of Hours: 6.5     Treatment Plan Summary:Patient transferred to [address] from [address] due to disorganized , labile presentation , he is currently on forced medication order - started on 11.16.17 - valid for 7 days . Will continue to readjust medications.  Will continue today 07/24/16  plan as below except where it is noted.  Schizoaffective disorder, depressive type (HCC) Unstable- improving   Daily contact with patient to assess and evaluate symptoms and progress in treatment and Medication management  Reviewed past medical records,treatment plan- I have reviewed Dr.Cobos notes in EHR , also discussed case with  him.  For psychosis: Will continue Haldol 5 mg PO/IM bid . Will continue Cogentin 0.5 mg PO/IM for EPS. Will start Haldol decanoate 100 mg IM q30 days - first dose 07/24/16  For mood lability: Will reduce Depakote ER to 750  mg po qhs .Pt wants to be on lower dose of depakote. Depakote level  07/24/16 - Monday- subtherapeutic -36 - pt however was not compliant .   For insomnia: Will continue Trazodone 50 mg po qhs prn.  For agitation/anxiety: Will continue PRN medications as per Unit protocol.  Will continue to monitor vitals ,medication compliance and treatment side effects while patient is here.   Will monitor for medical issues as well as call consult as needed.   Reviewed labs lipid panel - wnl, tsh - wnl  , hba1c- 4.6, pl- 17.4   Ordered EKG for qtc - patient refused - will re order.  CSW will continue working on disposition. Pt could return to grand parents when stable.  Patient to participate in therapeutic milieu .       Kenneth Culley, MD 07/24/2016, 1:58 PM

## 2016-07-25 NOTE — [facility] Group Notes (Signed)
Pt did not attend group meeting this evening.  

## 2016-07-25 NOTE — Progress Notes (Signed)
[facility]  07/25/2016 12:18 PM Talitha GivensDeshea Sawaya  MRN:  [id number] Subjective:  Patient states " I am still hopeless and anxious."   Objective:Tayt Butleris a 30 y.o.[name], who has a hx of schizoaffective disorder , who [facility] for worsening suicidal thoughts with plan to jump off a bridge.  Patient seen and chart reviewed.Discussed patient with treatment team.  Pt recently transferred to 500 H from [address] due to disorganized behavior. Pt is currently compliant on his medications. Pt continues to report hopelessness and anxiety about the future. Pt reports he is happy that he is getting support from his grand parents . Pt received his haldol decanoate yesterday - tolerated it well. Offered PRN medications for anxiety and will uptitrate depakote as needed.Pt due for depakote level in 3 days. Continue to encourage and support.    Principal Problem: Schizoaffective disorder, depressive type (HCC) Diagnosis:   Patient Active Problem List   Diagnosis Date Noted  . Schizoaffective disorder, depressive type (HCC) [F25.1] 07/21/2016   Total Time spent with patient: 25 minutes  Past Psychiatric History: [facility], hx of schizoaffective disorder, used to follow up with [facility], noncompliant with treatment. Please also see H&P.  Past Medical History:  Past Medical History:  Diagnosis Date  . Anxiety   . Depression   . Right clavicle fracture    History reviewed. No pertinent surgical history. Family History: Pt reports he does not have any family, does not know their hx. Family Psychiatric  History: does not know his family hx. Social History: pt is homeless, please also see H&p. History  Alcohol Use  . Yes    Comment: occasionally     History  Drug Use  . Types: Marijuana    Social History   Social History  . Marital status: Single    Spouse name: N/A  . Number of children: N/A  . Years of education:  N/A   Social History Main Topics  . Smoking status: Current Every Day Smoker    Packs/day: 0.50    Types: Cigarettes  . Smokeless tobacco: Never Used  . Alcohol use Yes     Comment: occasionally  . Drug use:     Types: Marijuana  . Sexual activity: No   Other Topics Concern  . None   Social History Narrative  . None   Additional Social History:                         Sleep: Fair  Appetite:  Fair  Current Medications: Current Facility-Administered Medications  Medication Dose Route Frequency Provider Last Rate Last Dose  . acetaminophen (TYLENOL) tablet 650 mg  650 mg Oral Q6H PRN Adonis BrookSheila Agustin, NP   650 mg at 07/20/16 1603  . alum & mag hydroxide-simeth (MAALOX/MYLANTA) 200-200-20 MG/5ML suspension 30 mL  30 mL Oral Q4H PRN Adonis BrookSheila Agustin, NP      . bacitracin ointment   Topical TID Jomarie LongsSaramma Kaysin Brock, MD      . benztropine (COGENTIN) tablet 0.5 mg  0.5 mg Oral BID Jomarie LongsSaramma Kanoelani Dobies, MD   0.5 mg at 07/25/16 0748   Or  . benztropine mesylate (COGENTIN) injection 0.5 mg  0.5 mg Intramuscular BID Jomarie LongsSaramma Everlean Bucher, MD      . divalproex (DEPAKOTE ER) 24 hr tablet 750 mg  750 mg Oral QHS Jomarie LongsSaramma Ahmiyah Coil, MD   750 mg at 07/24/16 2125  . haloperidol (  HALDOL) tablet 5 mg  5 mg Oral BID Jomarie LongsSaramma Nikolay Demetriou, MD   5 mg at 07/25/16 0748   Or  . haloperidol lactate (HALDOL) injection 5 mg  5 mg Intramuscular BID Jomarie LongsSaramma Kalika Smay, MD      . haloperidol decanoate (HALDOL DECANOATE) 100 MG/ML injection 100 mg  100 mg Intramuscular Q30 days Jomarie LongsSaramma Luie Laneve, MD   100 mg at 07/24/16 1452  . LORazepam (ATIVAN) tablet 1 mg  1 mg Oral Q6H PRN Jomarie LongsSaramma Jefferey Lippmann, MD   1 mg at 07/21/16 0802   Or  . LORazepam (ATIVAN) injection 1 mg  1 mg Intramuscular Q6H PRN Keola Heninger, MD      . magnesium hydroxide (MILK OF MAGNESIA) suspension 30 mL  30 mL Oral Daily PRN Adonis BrookSheila Agustin, NP      . OLANZapine (ZYPREXA) tablet 10 mg  10 mg Oral TID PRN Jomarie LongsSaramma Elveria Lauderbaugh, MD   10 mg at 07/20/16 1809   Or  . OLANZapine  (ZYPREXA) injection 10 mg  10 mg Intramuscular TID PRN Jomarie LongsSaramma Deangela Randleman, MD      . traZODone (DESYREL) tablet 50 mg  50 mg Oral QHS PRN Adonis BrookSheila Agustin, NP        Lab Results:  Results for orders placed or performed during the hospital encounter of 07/18/16 (from the past 48 hour(s))  Valproic acid level     Status: Abnormal   Collection Time: 07/24/16  6:15 AM  Result Value Ref Range   Valproic Acid Lvl 36 (L) 50.0 - 100.0 ug/mL    Comment: [facility]    Blood Alcohol level:  Lab Results  Component Value Date   [facility] 07/17/2016   ETH <5 06/23/2016    Metabolic Disorder Labs: Lab Results  Component Value Date   HGBA1C 4.6 (L) 07/21/2016   MPG 85 07/21/2016   Lab Results  Component Value Date   PROLACTIN 17.4 (H) 07/21/2016   Lab Results  Component Value Date   CHOL 190 07/21/2016   TRIG 137 07/21/2016   HDL 60 07/21/2016   CHOLHDL 3.2 07/21/2016   VLDL 27 07/21/2016   LDLCALC 103 (H) 07/21/2016    Physical Findings: AIMS: Facial and Oral Movements Muscles of Facial Expression: None, normal Lips and Perioral Area: None, normal Jaw: None, normal Tongue: None, normal,Extremity Movements Upper (arms, wrists, hands, fingers): None, normal Lower (legs, knees, ankles, toes): None, normal, Trunk Movements Neck, shoulders, hips: None, normal, Overall Severity Severity of abnormal movements (highest score from questions above): None, normal Incapacitation due to abnormal movements: None, normal Patient's awareness of abnormal movements (rate only patient's report): No Awareness, Dental Status Current problems with teeth and/or dentures?: No Does patient usually wear dentures?: No  CIWA:  CIWA-Ar Total: 4 COWS:     Musculoskeletal: Strength & Muscle Tone: within normal limits Gait & Station: normal Patient leans: N/A  Psychiatric Specialty Exam: Physical Exam  Nursing note and vitals reviewed.   Review of Systems   Psychiatric/Behavioral: Positive for depression. The patient is nervous/anxious.   All other systems reviewed and are negative.   Blood pressure 124/76, pulse 75, temperature 98.4 F (36.9 C), temperature source Oral, resp. rate 18, height 5\' 3"  (1.6 m), weight 63.5 kg (140 lb).Body mass index is 24.8 kg/m.  General Appearance: Fairly Groomed  Eye Contact:  Fair  Speech:  Normal Rate, less pressured  Volume:  Increased  Mood:  Depressed is hopeless   Affect:  Labile  Thought Process:  Goal Directed  and Descriptions of Associations: Circumstantial more organized   Orientation:  Other:  place, person  Thought Content:  Delusions, Paranoid Ideation and Rumination  On and off   Suicidal Thoughts:  No reports he is hopeless  Homicidal Thoughts:  No  Memory:  Immediate;   Fair Recent;   Fair Remote;   Fair  Judgement:  Fair  Insight:  Shallow  Psychomotor Activity:  Restlessness  Concentration:  Concentration: Fair and Attention Span: Fair  Recall:  [employer] of Knowledge:  Fair  Language:  Fair  Akathisia:  No  Handed:  Right  AIMS (if indicated):     Assets:  Physical Health  ADL's:  Intact  Cognition:  WNL  Sleep:  Number of Hours: 6.75     Treatment Plan Summary:Patient transferred to 500 H from 400 H due to disorganized , labile presentation, he is making progress , continues to be hopeless , will continue to treat.   Will continue today 07/25/16  plan as below except where it is noted.  Schizoaffective disorder, depressive type (HCC) Unstable- improving   Daily contact with patient to assess and evaluate symptoms and progress in treatment and Medication management   For psychosis: Will continue Haldol 5 mg PO bid . Will continue Cogentin 0.5 mg PO for EPS. Will start Haldol decanoate 100 mg IM q30 days - first dose 07/24/16  For mood lability: Reduced Depakote ER to 750  mg po qhs .Pt wants to be on lower dose of depakote. Depakote level  07/24/16 - Monday-  subtherapeutic -36 - pt however was not compliant . Will get another level 07/28/16.  For insomnia: Will continue Trazodone 50 mg po qhs prn.  For agitation/anxiety: Will continue PRN medications as per Unit protocol.  Will continue to monitor vitals ,medication compliance and treatment side effects while patient is here.   Will monitor for medical issues as well as call consult as needed.   Reviewed labs lipid panel - wnl, tsh - wnl  , hba1c- 4.6, pl- 17.4   Ordered EKG for qtc - patient refused - will re order.  CSW will continue working on disposition. Pt could return to grand parents when stable.  Patient to participate in therapeutic milieu .       Tamotsu Wiederholt, MD 07/25/2016, 12:18 PM

## 2016-07-25 NOTE — Progress Notes (Signed)
Recreation Therapy Notes  Date: 07/25/16 Time: 1000 Location: 500 Hall Dayroom  Group Topic: Coping Skills  Goal Area(s) Addresses:  Pt will be able to identify positive coping skills. Pt will be able to identify the benefits of using coping skills post d/c.  Behavioral Response: Engaged  Intervention: Can with coping skills and various words written on paper  Activity: Electrical engineerCoping Skills Pictionary.  Patients picked Robinson piece of paper from the can with Robinson word on it.  Patient was to draw what was on the strip of paper on the board.  The remainder of the group had to try and guess what the person was drawing.  The person that guesses the picture will get the next turn.   Education: PharmacologistCoping Skills, Building control surveyorDischarge Planning.   Education Outcome: Acknowledges understanding/In group clarification offered/Needs additional education.   Clinical Observations/Feedback: Pt stated coping skills "help you keep your sanity".  Pt was on task and bright.  Pt was interacting well with peers.     Kenneth RancherMarjette Thara Robinson, [name]     [name], [name] 07/25/2016 12:00 PM

## 2016-07-25 NOTE — Progress Notes (Signed)
Patient ID: [name], male   DOB: Jan 31, 1986, 30 y.o.   MRN: [id number] D: client visible on the unit, seen in dayroom watching TV. Client interacts more today, smiles. Client denies AVH, SHI. A: Writer encourage client to report any concerns. Medications reviewed, administered as ordered. Staff will monitor q3415min for safety. R: Client is safe on the unit, attended group.

## 2016-07-25 NOTE — Progress Notes (Signed)
Patient reports relief in psychotic symptoms and acknowledges the need for psychiatric medications. Patient denies SI and HI and has been compliant with medications.   Assess patient for safety, offer medications as prescribed, continue to engage patient in 1:1 staff talks.   Continue to monitor patient for safety.

## 2016-07-26 NOTE — Progress Notes (Signed)
Recreation Therapy Notes  Date: 07/26/16 Time: 1000 Location: 500 Hall Dayroom  Group Topic: Self-Expression  Goal Area(s) Addresses:  Patient will identify things they are thankful for. Patient will verbalize benefit of being thankful.  Intervention: Worksheet with a picture frame, markers  Activity: What I'm Thankful For.  Patients were given a worksheet with the outline of a picture frame.  Patients were to draw, write a poem or list the things they were thankful for.  Education:  Self-Expression, Discharge Planning.   Education Outcome: Acknowledges education/In group clarification offered/Needs additional education  Clinical Observations/Feedback: Pt did not attend group.   Desirre Eickhoff, LRT/CTRS         Helyn Schwan A 07/26/2016 11:25 AM 

## 2016-07-26 NOTE — Plan of Care (Signed)
Problem: Safety: Goal: Periods of time without injury will increase Outcome: Progressing Client has remained injury free AEB q3215min safety checks, client remains safe on the unit.

## 2016-07-26 NOTE — [facility] Group Notes (Signed)
[facility] Therapy  07/26/2016 , 1:28 PM    Type of Therapy:  [facility] [employer] Presentation  Participation Level:  Active  Participation Quality:  Attentive  Affect:  Blunted  Cognitive:  Oriented  Insight:  Limited  Engagement in Therapy:  Engaged  Modes of Intervention:  Discussion, Education and Socialization  Summary of Progress/Problems:  [name] from [employer] came to present his recovery story and play the guitar.  Invited.  Declined to attend.  [name], [name] 07/26/2016 , 1:28 PM

## 2016-07-26 NOTE — Progress Notes (Signed)
Patient ID: Kenneth GivensDeshea Robinson, male   DOB: 08-07-1986, 30 y.o.   MRN: [id number] D: Client in bed initially but up for group and snacks. Client reports no problems, but focus seems to be on Haldol injection administered days ago. When asked if the tylenol received earlier today helped he reports " Yea, no I don't really know." A:  Client encouraged to move arm to prevent continued soreness. No body stiffness noted.  Medication reviewed, administered as ordered. Staff will monitor q7515min for safety. R: Client is safe on the unit, attended group.

## 2016-07-26 NOTE — Progress Notes (Signed)
[facility]  07/26/2016 11:03 AM [name]  MRN:  [id number] Subjective:  Patient states " Its just that my thoughts can be like that sometimes.'   Objective:Kenneth Butleris a 30 y.o.[name], who has a hx of schizoaffective disorder , who [facility] thoughts with plan to jump off a bridge.  Patient seen and chart reviewed.Discussed patient with treatment team.  Pt recently transferred to 500 H from 400 H due to disorganized behavior. Pt today continues to be irritable , loud at times , however is making progress. Pt per staff continues to be compliant on medications , denies any ADRs . Pt however has been c/o pain at the injection site where LAI was administered - will apply ice pack and observe. Continue to encourage and support.     Principal Problem: Schizoaffective disorder, depressive type (HCC) Diagnosis:   Patient Active Problem List   Diagnosis Date Noted  . Schizoaffective disorder, depressive type (HCC) [F25.1] 07/21/2016   Total Time spent with patient: 25 minutes  Past Psychiatric History: [facility], hx of schizoaffective disorder, used to follow up with [facility], noncompliant with treatment. Please also see H&P.  Past Medical History:  Past Medical History:  Diagnosis Date  . Anxiety   . Depression   . Right clavicle fracture    History reviewed. No pertinent surgical history. Family History: Pt reports he does not have any family, does not know their hx. Family Psychiatric  History: does not know his family hx. Social History: pt is homeless, please also see H&p. History  Alcohol Use  . Yes    Comment: occasionally     History  Drug Use  . Types: Marijuana    Social History   Social History  . Marital status: Single    Spouse name: N/A  . Number of children: N/A  . Years of education: N/A   Social History Main Topics  . Smoking status: Current Every Day  Smoker    Packs/day: 0.50    Types: Cigarettes  . Smokeless tobacco: Never Used  . Alcohol use Yes     Comment: occasionally  . Drug use:     Types: Marijuana  . Sexual activity: No   Other Topics Concern  . None   Social History Narrative  . None   Additional Social History:                         Sleep: Fair  Appetite:  Fair  Current Medications: Current Facility-Administered Medications  Medication Dose Route Frequency Provider Last Rate Last Dose  . acetaminophen (TYLENOL) tablet 650 mg  650 mg Oral Q6H PRN Adonis BrookSheila Agustin, NP   650 mg at 07/20/16 1603  . alum & mag hydroxide-simeth (MAALOX/MYLANTA) 200-200-20 MG/5ML suspension 30 mL  30 mL Oral Q4H PRN Adonis BrookSheila Agustin, NP      . bacitracin ointment   Topical TID Kenneth LongsSaramma Jaylynne Birkhead, MD      . benztropine (COGENTIN) tablet 0.5 mg  0.5 mg Oral BID Kenneth LongsSaramma Jaylianna Tatlock, MD   0.5 mg at 07/26/16 0812  . divalproex (DEPAKOTE ER) 24 hr tablet 750 mg  750 mg Oral QHS Kenneth Croak, MD   750 mg at 07/25/16 2123  . haloperidol (HALDOL) tablet 5 mg  5 mg Oral BID Kenneth LongsSaramma Jerelyn Trimarco, MD   5 mg at 07/26/16 0812  . haloperidol decanoate (HALDOL DECANOATE) 100 MG/ML injection  100 mg  100 mg Intramuscular Q30 days Kenneth LongsSaramma Delaney Perona, MD   100 mg at 07/24/16 1452  . LORazepam (ATIVAN) tablet 1 mg  1 mg Oral Q6H PRN Kenneth LongsSaramma Vane Yapp, MD   1 mg at 07/21/16 0802   Or  . LORazepam (ATIVAN) injection 1 mg  1 mg Intramuscular Q6H PRN Kenneth Smither, MD      . magnesium hydroxide (MILK OF MAGNESIA) suspension 30 mL  30 mL Oral Daily PRN Adonis BrookSheila Agustin, NP      . OLANZapine (ZYPREXA) tablet 10 mg  10 mg Oral TID PRN Kenneth LongsSaramma Johnattan Strassman, MD   10 mg at 07/20/16 1809   Or  . OLANZapine (ZYPREXA) injection 10 mg  10 mg Intramuscular TID PRN Kenneth LongsSaramma Nykeem Citro, MD      . traZODone (DESYREL) tablet 50 mg  50 mg Oral QHS PRN Adonis BrookSheila Agustin, NP        Lab Results:  No results found for this or any previous visit (from the past 48 hour(s)).  Blood Alcohol level:  Lab  Results  Component Value Date   [facility] 07/17/2016   ETH <5 06/23/2016    Metabolic Disorder Labs: Lab Results  Component Value Date   HGBA1C 4.6 (L) 07/21/2016   MPG 85 07/21/2016   Lab Results  Component Value Date   PROLACTIN 17.4 (H) 07/21/2016   Lab Results  Component Value Date   CHOL 190 07/21/2016   TRIG 137 07/21/2016   HDL 60 07/21/2016   CHOLHDL 3.2 07/21/2016   VLDL 27 07/21/2016   LDLCALC 103 (H) 07/21/2016    Physical Findings: AIMS: Facial and Oral Movements Muscles of Facial Expression: None, normal Lips and Perioral Area: None, normal Jaw: None, normal Tongue: None, normal,Extremity Movements Upper (arms, wrists, hands, fingers): None, normal Lower (legs, knees, ankles, toes): None, normal, Trunk Movements Neck, shoulders, hips: None, normal, Overall Severity Severity of abnormal movements (highest score from questions above): None, normal Incapacitation due to abnormal movements: None, normal Patient's awareness of abnormal movements (rate only patient's report): No Awareness, Dental Status Current problems with teeth and/or dentures?: No Does patient usually wear dentures?: No  CIWA:  CIWA-Ar Total: 4 COWS:     Musculoskeletal: Strength & Muscle Tone: within normal limits Gait & Station: normal Patient leans: N/A  Psychiatric Specialty Exam: Physical Exam  Nursing note and vitals reviewed.   Review of Systems  Psychiatric/Behavioral: Positive for depression. The patient is nervous/anxious.   All other systems reviewed and are negative.   Blood pressure 124/86, pulse 76, temperature 98.4 F (36.9 C), temperature source Oral, resp. rate 18, height 5\' 3"  (1.6 m), weight 63.5 kg (140 lb).Body mass index is 24.8 kg/m.  General Appearance: Fairly Groomed  Eye Contact:  Fair  Speech:  Normal Rate   Volume:  Increased on and off  Mood:  Anxious and Depressed improving  Affect:  Congruent  Thought Process:  Goal Directed and Descriptions of  Associations: Circumstantial  Orientation:  Other:  place, person  Thought Content:  Paranoid Ideation and Rumination   Suicidal Thoughts:  No   Homicidal Thoughts:  No  Memory:  Immediate;   Fair Recent;   Fair Remote;   Fair  Judgement:  Fair  Insight:  Shallow  Psychomotor Activity:  Restlessness  Concentration:  Concentration: Fair and Attention Span: Fair  Recall:  [employer]Fair  Fund of Knowledge:  Fair  Language:  Fair  Akathisia:  No  Handed:  Right  AIMS (if indicated):  Assets:  Physical Health Social Support  ADL's:  Intact  Cognition:  WNL  Sleep:  Number of Hours: 6.5     Treatment Plan Summary:Patient transferred to 500 H from 400 H due to disorganized behavior and psychosis , pt was initially placed on forced medication order , however currently he is making progress, is compliant on his medications . Will continue to monitor.    Will continue today 07/26/16  plan as below except where it is noted.  Schizoaffective disorder, depressive type (HCC) Unstable- improving   Daily contact with patient to assess and evaluate symptoms and progress in treatment and Medication management   For psychosis: Will continue Haldol 5 mg PO bid . Will continue Cogentin 0.5 mg PO for EPS. Offered Haldol decanoate 100 mg IM q30 days - first dose 07/24/16  For mood lability: Reduced Depakote ER to 750  mg po qhs .Pt wants to be on lower dose of depakote. Depakote level  07/24/16 - Monday- subtherapeutic -36 - pt however was not compliant . Will get another level 07/28/16.  For insomnia: Will continue Trazodone 50 mg po qhs prn.  For agitation/anxiety: Will continue PRN medications as per Unit protocol.  Will continue to monitor vitals ,medication compliance and treatment side effects while patient is here.   Will monitor for medical issues as well as call consult as needed.   Reviewed labs lipid panel - wnl, tsh - wnl  , hba1c- 4.6, pl- 17.4   Ordered EKG for qtc -  patient refused - will re order.  CSW will continue working on disposition. Pt could return to grand parents when stable.  Patient to participate in therapeutic milieu .       Stacey Sago, MD 07/26/2016, 11:03 AM

## 2016-07-26 NOTE — Progress Notes (Signed)
D: Patient denies SI/HI and A/V hallucinations; patient complains of soreness at the arm site, no redness or swelling is at the site; patient has full range of motion of the right arm  A: Monitored q 15 minutes; patient encouraged to attend groups; patient educated about medications; patient given medications per physician orders; patient encouraged to express feelings and/or concerns  R: Patient is cooperative; patient is very assertive; patient's interaction with staff and peers is isolative; patient was able to set goal to talk with staff 1:1 when having feelings of SI; patient is taking medications as prescribed and tolerating medications

## 2016-07-27 DIAGNOSIS — F1099 Alcohol use, unspecified with unspecified alcohol-induced disorder: Secondary | ICD-10-CM

## 2016-07-27 NOTE — Progress Notes (Addendum)
Patient ID: Kenneth GivensDeshea Robinson, male   DOB: 10-16-1985, 30 y.o.   MRN: [id number]    D: Pt has been very labile on the unit today. At times patient has been very blunted and aggressive in tone when asking  his medication. Pt would take all medications as prescribed by the doctor. Pt reported that his depression was a 3, his hopelessness was a 0, and his anxiety was a 0. Pt reported that he had no goal for today, but that this writer could get him some soap and socks. Pt was provided soap, socks, toothpaste, and toothbrush. Pt reported being negative SI/HI, no AH/VH noted. A: 15 min checks continued for patient safety. R: Pt safety maintained.

## 2016-07-27 NOTE — Progress Notes (Signed)
Adult Psychoeducational Group Note  Date:  07/27/2016 Time:  8:42 PM  Group Topic/Focus:  Wrap-Up Group:   The focus of this group is to help patients review their daily goal of treatment and discuss progress on daily workbooks.   Participation Level:  Active  Participation Quality:  Appropriate  Affect:  Appropriate  Cognitive:  Appropriate  Insight: Appropriate  Engagement in Group:  Engaged  Modes of Intervention:  Discussion  Additional Comments: The patient expressed that he had a wonderful day. Octavio Mannshigpen, Liya Strollo Lee 07/27/2016, 8:42 PM

## 2016-07-27 NOTE — Progress Notes (Signed)
D: Pt denies SI/HI/AVH. Pt appears very paranoid, but pt will engage when spoken to. Pt aggressive verbally, but only with his tone, but pt does not appear to mean anything with it. Pt continues to isolate to room only coming out for snack and medications.   A: Pt was offered support and encouragement. Pt was given scheduled medications. Pt was encourage to attend groups. Q 15 minute checks were done for safety.   R: Pt is taking medication. Pt has no complaints.Pt receptive to treatment and safety maintained on unit.

## 2016-07-27 NOTE — Progress Notes (Signed)
[facility] MD Progress Note  07/27/2016 1:33 PM [name]  MRN:  [id number] Subjective:  Patient states, "I'm ok."  Pleasant and approachable.   Objective:Kenneth Butleris a 30 y.o.[name], who has a hx of schizoaffective disorder , who presented to  [facility] via [facility] for worsening suicidal thoughts with plan to jump off a bridge.  Patient seen and chart reviewed.Discussed patient with treatment team.  Pt today continues to be irritable however has made significant progress. Pt per staff continues to be compliant on medications , denies any ADRs . Pt however has been c/o pain at the injection site where LAI was administered - will apply ice pack and observe. Continue to encourage and support. Approachable today.  Principal Problem: Schizoaffective disorder, depressive type (HCC) Diagnosis:   Patient Active Problem List   Diagnosis Date Noted  . Schizoaffective disorder, depressive type (HCC) [F25.1] 07/21/2016   Total Time spent with patient: 25 minutes  Past Psychiatric History: Patient with several admissions to [facility] in the past, hx of schizoaffective disorder, used to follow up with [facility], noncompliant with treatment. Please also see H&P.  Past Medical History:  Past Medical History:  Diagnosis Date  . Anxiety   . Depression   . Right clavicle fracture    History reviewed. No pertinent surgical history. Family History: Pt reports he does not have any family, does not know their hx. Family Psychiatric  History: does not know his family hx. Social History: pt is homeless, please also see H&p. History  Alcohol Use  . Yes    Comment: occasionally     History  Drug Use  . Types: Marijuana    Social History   Social History  . Marital status: Single    Spouse name: N/A  . Number of children: N/A  . Years of education: N/A   Social History Main Topics  . Smoking status: Current Every Day Smoker    Packs/day: 0.50    Types: Cigarettes  . Smokeless tobacco: Never Used   . Alcohol use Yes     Comment: occasionally  . Drug use:     Types: Marijuana  . Sexual activity: No   Other Topics Concern  . None   Social History Narrative  . None   Additional Social History:                         Sleep: Fair  Appetite:  Fair  Current Medications: Current Facility-Administered Medications  Medication Dose Route Frequency Provider Last Rate Last Dose  . acetaminophen (TYLENOL) tablet 650 mg  650 mg Oral Q6H PRN Adonis Brook, NP   650 mg at 07/26/16 1622  . alum & mag hydroxide-simeth (MAALOX/MYLANTA) 200-200-20 MG/5ML suspension 30 mL  30 mL Oral Q4H PRN Adonis Brook, NP      . bacitracin ointment   Topical TID Jomarie Longs, MD      . benztropine (COGENTIN) tablet 0.5 mg  0.5 mg Oral BID Jomarie Longs, MD   0.5 mg at 07/27/16 0731  . divalproex (DEPAKOTE ER) 24 hr tablet 750 mg  750 mg Oral QHS Saramma Eappen, MD   750 mg at 07/26/16 2122  . haloperidol (HALDOL) tablet 5 mg  5 mg Oral BID Jomarie Longs, MD   5 mg at 07/27/16 0731  . haloperidol decanoate (HALDOL DECANOATE) 100 MG/ML injection 100 mg  100 mg Intramuscular Q30 days Jomarie Longs, MD   100 mg at 07/24/16 1452  . LORazepam (ATIVAN) tablet  1 mg  1 mg Oral Q6H PRN Jomarie LongsSaramma Eappen, MD   1 mg at 07/21/16 0802   Or  . LORazepam (ATIVAN) injection 1 mg  1 mg Intramuscular Q6H PRN Saramma Eappen, MD      . magnesium hydroxide (MILK OF MAGNESIA) suspension 30 mL  30 mL Oral Daily PRN Adonis BrookSheila Braxxton Stoudt, NP      . OLANZapine (ZYPREXA) tablet 10 mg  10 mg Oral TID PRN Jomarie LongsSaramma Eappen, MD   10 mg at 07/20/16 1809   Or  . OLANZapine (ZYPREXA) injection 10 mg  10 mg Intramuscular TID PRN Jomarie LongsSaramma Eappen, MD      . traZODone (DESYREL) tablet 50 mg  50 mg Oral QHS PRN Adonis BrookSheila Savhanna Sliva, NP        Lab Results:  No results found for this or any previous visit (from the past 48 hour(s)).  Blood Alcohol level:  Lab Results  Component Value Date   [facility] Institute IncETH <5 07/17/2016   ETH <5 06/23/2016     Metabolic Disorder Labs: Lab Results  Component Value Date   HGBA1C 4.6 (L) 07/21/2016   MPG 85 07/21/2016   Lab Results  Component Value Date   PROLACTIN 17.4 (H) 07/21/2016   Lab Results  Component Value Date   CHOL 190 07/21/2016   TRIG 137 07/21/2016   HDL 60 07/21/2016   CHOLHDL 3.2 07/21/2016   VLDL 27 07/21/2016   LDLCALC 103 (H) 07/21/2016    Physical Findings: AIMS: Facial and Oral Movements Muscles of Facial Expression: None, normal Lips and Perioral Area: None, normal Jaw: None, normal Tongue: None, normal,Extremity Movements Upper (arms, wrists, hands, fingers): None, normal Lower (legs, knees, ankles, toes): None, normal, Trunk Movements Neck, shoulders, hips: None, normal, Overall Severity Severity of abnormal movements (highest score from questions above): None, normal Incapacitation due to abnormal movements: None, normal Patient's awareness of abnormal movements (rate only patient's report): No Awareness, Dental Status Current problems with teeth and/or dentures?: No Does patient usually wear dentures?: No  CIWA:  CIWA-Ar Total: 4 COWS:     Musculoskeletal: Strength & Muscle Tone: within normal limits Gait & Station: normal Patient leans: N/A  Psychiatric Specialty Exam: Physical Exam  Nursing note and vitals reviewed.   Review of Systems  Psychiatric/Behavioral: Positive for depression. The patient is nervous/anxious.   All other systems reviewed and are negative.   Blood pressure 114/75, pulse 96, temperature 98.8 F (37.1 C), resp. rate 18, height 5\' 3"  (1.6 m), weight 63.5 kg (140 lb).Body mass index is 24.8 kg/m.  General Appearance: Fairly Groomed  Eye Contact:  Fair  Speech:  Normal Rate   Volume:  Increased on and off  Mood:  Anxious and Depressed improving  Affect:  Congruent  Thought Process:  Goal Directed and Descriptions of Associations: Circumstantial  Orientation:  Other:  place, person  Thought Content:  Paranoid  Ideation and Rumination   Suicidal Thoughts:  No   Homicidal Thoughts:  No  Memory:  Immediate;   Fair Recent;   Fair Remote;   Fair  Judgement:  Fair  Insight:  Shallow  Psychomotor Activity:  Restlessness  Concentration:  Concentration: Fair and Attention Span: Fair  Recall:  FiservFair  Fund of Knowledge:  Fair  Language:  Fair  Akathisia:  No  Handed:  Right  AIMS (if indicated):     Assets:  Physical Health Social Support  ADL's:  Intact  Cognition:  WNL  Sleep:  Number of Hours: 6    Treatment  Plan Summary:Patient transferred to [facility] from [facility] due to disorganized behavior and psychosis , pt was initially placed on forced medication order , however currently he is making progress, is compliant on his medications . Will continue to monitor.  Will continue today 07/27/16  plan as below except where it is noted.  Schizoaffective disorder, depressive type (HCC) Unstable- improving   Daily contact with patient to assess and evaluate symptoms and progress in treatment and Medication management  For psychosis: Will continue Haldol 5 mg PO bid . Will continue Cogentin 0.5 mg PO for EPS. Offered Haldol decanoate 100 mg IM q30 days - first dose 07/24/16  For mood lability: Reduced Depakote ER to 750  mg po qhs .Pt wants to be on lower dose of depakote. Depakote level  07/24/16 - Monday- subtherapeutic -36 - pt however was not compliant . Will get another level 07/28/16.  For insomnia: Will continue Trazodone 50 mg po qhs prn.  For agitation/anxiety: Will continue PRN medications as per Unit protocol.  Will continue to monitor vitals ,medication compliance and treatment side effects while patient is here.   Will monitor for medical issues as well as call consult as needed.   Reviewed labs lipid panel - wnl, tsh - wnl  , hba1c- 4.6, pl- 17.4   Ordered EKG for qtc - patient refused - will re order.  CSW will continue working on disposition. Pt could return to grand  parents when stable.  Patient to participate in therapeutic milieu .   Lindwood QuaSheila May Jaziyah Gradel, NP [facility] 07/27/2016, 1:33 PM

## 2016-07-28 MED ORDER — BENZTROPINE MESYLATE 0.5 MG PO TABS: 0.5000 mg | ORAL_TABLET | Freq: Two times a day (BID) | ORAL | 0 refills | Status: AC

## 2016-07-28 MED ORDER — HALOPERIDOL DECANOATE 100 MG/ML IM SOLN: 100.0000 mg | INTRAMUSCULAR | 0 refills | Status: AC

## 2016-07-28 MED ORDER — DIVALPROEX SODIUM ER 250 MG PO TB24: 750.0000 mg | ORAL_TABLET | Freq: Every day | ORAL | 0 refills | Status: AC

## 2016-07-28 MED ORDER — HALOPERIDOL 5 MG PO TABS: 5.0000 mg | ORAL_TABLET | Freq: Two times a day (BID) | ORAL | 0 refills | Status: AC

## 2016-07-28 MED ORDER — DIVALPROEX SODIUM ER 250 MG PO TB24
750.0000 mg | ORAL_TABLET | Freq: Every day | ORAL | Status: DC
  Filled 2016-07-28: qty 21

## 2016-07-28 MED ORDER — TRAZODONE HCL 50 MG PO TABS: 50.0000 mg | ORAL_TABLET | Freq: Every evening | ORAL | 0 refills | Status: AC | PRN

## 2016-07-28 NOTE — Progress Notes (Signed)
Patient ID: Kenneth GivensDeshea Robinson, male   DOB: [birth year], 30 y.o.   MRN: [id number]   Pt was discharged home with all discharge paperwork, no issues or concerns noted. Pt did report that he was not going to take the Trazodone any more because it caused him to bed wet. Pt reported that he was happy about discharge and that he was ready to go. Pt reported that he was negative SI/HI, no AH/VH noted. Pt reported that his depression was a 0, his hopelessness was a  0, and his anxiety was a 0.

## 2016-07-28 NOTE — Plan of Care (Signed)
Problem: Safety: Goal: Periods of time without injury will increase Outcome: Progressing Pt remains safe on the unit at this time

## 2016-07-28 NOTE — Progress Notes (Signed)
Recreation Therapy Notes  Date: 07/28/16 Time: 1000 Location: 500 [address] Dayroom  Group Topic: Communication, Team Building, Problem Solving  Goal Area(s) Addresses:  Patient will effectively work with peer towards shared goal.  Patient will identify skills used to make activity successful.  Patient will identify how skills used during activity can be used to reach post d/c goals.   Behavioral Response: Engaged  Intervention: STEM Activity  Activity: Stage managerLanding Pad. In teams patients were given 12 plastic drinking straws and a length of masking tape. Using the materials provided patients were asked to build a landing pad to catch a golf ball dropped from approximately 6 feet in the air.   Education: Pharmacist, communityocial Skills, Discharge Planning   Education Outcome: Acknowledges education/In group clarification offered/Needs additional education.   Clinical Observations/Feedback: Pt was active and engaged in the activity.  Pt worked well with his peers.  Pt stated they had to use strategy to complete the activity.  Pt also stated that this activity will help him moving forward because it will help him "watch people's behaviors to keep yourself safe because you can't under estimate people".    Caroll RancherMarjette Khylen Riolo, LRT/CTRS      Caroll RancherLindsay, Malay Fantroy A 07/28/2016 12:20 PM

## 2016-07-28 NOTE — Tx Team (Signed)
Interdisciplinary Treatment and Diagnostic Plan Update  07/28/2016 Time of Session: 8:55 AM  Kenneth GivensDeshea Robinson MRN: [id number]  Principal Diagnosis: Schizoaffective disorder, depressive type (HCC)  Secondary Diagnoses: Principal Problem:   Schizoaffective disorder, depressive type (HCC)   Current Medications:  Current Facility-Administered Medications  Medication Dose Route Frequency Provider Last Rate Last Dose  . acetaminophen (TYLENOL) tablet 650 mg  650 mg Oral Q6H PRN Adonis BrookSheila Agustin, NP   650 mg at 07/26/16 1622  . alum & mag hydroxide-simeth (MAALOX/MYLANTA) 200-200-20 MG/5ML suspension 30 mL  30 mL Oral Q4H PRN Adonis BrookSheila Agustin, NP      . bacitracin ointment   Topical TID Jomarie LongsSaramma Eappen, MD      . benztropine (COGENTIN) tablet 0.5 mg  0.5 mg Oral BID Jomarie LongsSaramma Eappen, MD   0.5 mg at 07/28/16 0747  . divalproex (DEPAKOTE ER) 24 hr tablet 750 mg  750 mg Oral QHS Saramma Eappen, MD   750 mg at 07/27/16 2028  . haloperidol (HALDOL) tablet 5 mg  5 mg Oral BID Jomarie LongsSaramma Eappen, MD   5 mg at 07/28/16 0747  . haloperidol decanoate (HALDOL DECANOATE) 100 MG/ML injection 100 mg  100 mg Intramuscular Q30 days Jomarie LongsSaramma Eappen, MD   100 mg at 07/24/16 1452  . LORazepam (ATIVAN) tablet 1 mg  1 mg Oral Q6H PRN Jomarie LongsSaramma Eappen, MD   1 mg at 07/21/16 0802   Or  . LORazepam (ATIVAN) injection 1 mg  1 mg Intramuscular Q6H PRN Saramma Eappen, MD      . magnesium hydroxide (MILK OF MAGNESIA) suspension 30 mL  30 mL Oral Daily PRN Adonis BrookSheila Agustin, NP      . OLANZapine (ZYPREXA) tablet 10 mg  10 mg Oral TID PRN Jomarie LongsSaramma Eappen, MD   10 mg at 07/20/16 1809   Or  . OLANZapine (ZYPREXA) injection 10 mg  10 mg Intramuscular TID PRN Jomarie LongsSaramma Eappen, MD      . traZODone (DESYREL) tablet 50 mg  50 mg Oral QHS PRN Adonis BrookSheila Agustin, NP        PTA Medications: Prescriptions Prior to Admission  Medication Sig Dispense Refill Last Dose  . risperiDONE (RISPERDAL) 3 MG tablet Take 1 tablet (3 mg total) by mouth at bedtime. For  mood stabilization. (Patient not taking: Reported on 07/17/2016) 30 tablet 0 Not Taking at Unknown time    Treatment Modalities: Medication Management, Group therapy, Case management,  1 to 1 session with clinician, Psychoeducation, Recreational therapy.  Patient Stressors: Financial difficulties Health problems Occupational concerns Substance abuse  Patient Strengths: Ability for Warden/rangerinsight Communication skills Motivation for treatment/growth  Physician Treatment Plan for Primary Diagnosis: Schizoaffective disorder, depressive type (HCC) Long Term Goal(s): Improvement in symptoms so as ready for discharge  Short Term Goals:  Compliance with prescribed medications will improve Ability to identify triggers associated with substance abuse/mental health issues will improve   Medication Management: Evaluate patient's response, side effects, and tolerance of medication regimen.  Therapeutic Interventions: 1 to 1 sessions, Unit Group sessions and Medication administration.  Evaluation of Outcomes: Adequate for Discharge  Physician Treatment Plan for Secondary Diagnosis: Principal Problem:   Schizoaffective disorder, depressive type (HCC)   Long Term Goal(s): Improvement in symptoms so as ready for discharge  Short Term Goals:  Compliance with prescribed medications will improve Ability to identify triggers associated with substance abuse/mental health issues will improve   Medication Management: Evaluate patient's response, side effects, and tolerance of medication regimen.  Therapeutic Interventions: 1 to 1 sessions, Unit Group sessions  and Medication administration.  Evaluation of Outcomes: Adequate for Discharge   RN Treatment Plan for Primary Diagnosis: Schizoaffective disorder, depressive type (HCC) Long Term Goal(s): Knowledge of disease and therapeutic regimen to maintain health will improve  Short Term Goals: Ability to verbalize feelings will improve, Ability to  disclose and discuss suicidal ideas and Ability to identify and develop effective coping behaviors will improve  Medication Management: RN will administer medications as ordered by provider, will assess and evaluate patient's response and provide education to patient for prescribed medication. RN will report any adverse and/or side effects to prescribing provider.  Therapeutic Interventions: 1 on 1 counseling sessions, Psychoeducation, Medication administration, Evaluate responses to treatment, Monitor vital signs and CBGs as ordered, Perform/monitor CIWA, COWS, AIMS and Fall Risk screenings as ordered, Perform wound care treatments as ordered.  Evaluation of Outcomes: Adequate for Discharge   LCSW Treatment Plan for Primary Diagnosis: Schizoaffective disorder, depressive type (HCC) Long Term Goal(s): Safe transition to appropriate next level of care at discharge, Engage patient in therapeutic group addressing interpersonal concerns.  Short Term Goals: Engage patient in aftercare planning with referrals and resources, Increase emotional regulation, Facilitate acceptance of mental health diagnosis and concerns and Increase skills for wellness and recovery  Therapeutic Interventions: Assess for all discharge needs, 1 to 1 time with Social worker, Explore available resources and support systems, Assess for adequacy in community support network, Educate family and significant other(s) on suicide prevention, Complete Psychosocial Assessment, Interpersonal group therapy.  Evaluation of Outcomes: Adequate for Discharge      Progress in Treatment: Attending groups: Yes Participating in groups: Yes Taking medication as prescribed: Yes, MD continues to assess for medication changes as needed Toleration medication: Yes, no side effects reported at this time Family/Significant other contact made:Yes Patient understands diagnosis: No  Limited insight Discussing patient identified problems/goals with  staff: Yes Medical problems stabilized or resolved: Yes Denies suicidal/homicidal ideation: Yes Issues/concerns per patient self-inventory: None Other: N/A  New problem(s) identified: None identified at this time.   New Short Term/Long Term Goal(s): None identified at this time.   Discharge Plan or Barriers: Return home[facility] [place]  Reason for Continuation of Hospitalization:     Estimated Length of Stay: D/C today  Attendees: Patient: 07/28/2016  8:55 AM  Physician: Jomarie LongsSaramma Eappen 07/28/2016  8:55 AM  Nursing:Elizabeth Josie SaundersIwenekha, RN 07/28/2016  8:55 AM  RN Care Manager: Onnie BoerJennifer Clark, RN 07/28/2016  8:55 AM  Social Worker:  Richelle Itood Mate Alegria, LCSW 07/28/2016  8:55 AM  Recreational Therapist:  07/28/2016  8:55 AM  Other:  NP 07/28/2016  8:55 AM  Other:  07/28/2016  8:55 AM  Other: 07/28/2016  8:55 AM    Scribe for Treatment Team: Richelle Itood Shakendra Griffeth, LCSW 07/28/2016 8:55 AM

## 2016-07-28 NOTE — [facility] Suicide Risk Assessment (Signed)
[facility]   Principal Problem: Schizoaffective disorder, depressive type [facility](HCC) Discharge Diagnoses:  Patient Active Problem List   Diagnosis Date Noted  . Schizoaffective disorder, depressive type (HCC) [F25.1] 07/21/2016    Total Time spent with patient: 30 minutes  Musculoskeletal: Strength & Muscle Tone: within normal limits Gait & Station: normal Patient leans: N/A  Psychiatric Specialty Exam: ROS  Blood pressure 114/75, pulse 96, temperature 98.8 F (37.1 C), resp. rate 18, height 5\' 3"  (1.6 m), weight 63.5 kg (140 lb).Body mass index is 24.8 kg/m.  General Appearance: Casual and Fairly Groomed  Patent attorneyye Contact::  Fair  Speech:  Clear and Coherent409  Volume:  Normal  Mood:  Anxious  Affect:  Appropriate and Congruent  Thought Process:  Goal Directed  Orientation:  Full (Time, Place, and Person)  Thought Content:  WDL and Logical  Suicidal Thoughts:  No  Homicidal Thoughts:  No  Memory:  Immediate;   Good Recent;   Good Remote;   Fair  Judgement:  Good  Insight:  Good  Psychomotor Activity:  Normal  Concentration:  Fair  Recall:  Fair  Fund of Knowledge:Good  Language: Good  Akathisia:  No  Handed:  Right  AIMS (if indicated):     Assets:  Communication Skills Desire for Improvement Social Support  Sleep:  Number of Hours: 6.75  Cognition: WNL  ADL's:  Intact   Mental Status Per Nursing Assessment::   On Admission:     Demographic Factors:  Low socioeconomic status  Loss Factors: Decrease in vocational status and Financial problems/change in socioeconomic status  Historical Factors: Prior suicide attempts  Risk Reduction Factors:   Sense of responsibility to family, Religious beliefs about death, Living with another person, especially a relative, Positive social support, Positive therapeutic relationship and Positive coping skills or problem solving skills  Continued Clinical Symptoms:  Unstable or Poor Therapeutic  Relationship Previous Psychiatric Diagnoses and Treatments  Cognitive Features That Contribute To Risk:  None    Suicide Risk:  Minimal: No identifiable suicidal ideation.  Patients presenting with no risk factors but with morbid ruminations; may be classified as minimal risk based on the severity of the depressive symptoms  Follow-up Information    [facility] Follow up on 08/02/2016.   Why:  Wednesday at 8:00 for your hospital follow up appointment Contact information: [address]  [place] [phone]] [phone]          Plan Of Care/Follow-up recommendations:  Activity:  As tolerated Diet:  Unchanged from the past Other:  [facility]  Please see discharge summary for more details  Wardell Pokorski T., MD 07/28/2016, 11:26 AM

## 2016-07-28 NOTE — Progress Notes (Signed)
  [facility] Discharge Plan :  Will you be returning to the same living situation after discharge:  Yes,  home with grandparents At discharge, do you have transportation home?: Yes,  family Do you have the ability to pay for your medications: Yes,  MCD  Release of information consent forms completed and in the chart;  Patient's signature needed at discharge.  Patient to Follow up at: Follow-up Information    [facility] Follow up on 08/02/2016.   Why:  Wednesday at 8:00 for your hospital follow up appointment Contact information: [address]  [place] [336] [phone]          Next level of care provider has [facility] to [facility]:no  Safety Planning and Suicide Prevention discussed: Yes,  yes  Have you used any form of tobacco in the last 30 days? (Cigarettes, Smokeless Tobacco, Cigars, and/or Pipes): Yes  Has patient been referred to the Quitline?: Patient refused referral  Patient has been referred for addiction treatment: N/A  [name] 07/28/2016, 11:07 AM
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
## 2017-01-31 ENCOUNTER — Emergency Department (HOSPITAL_COMMUNITY)
Admission: EM | Admit: 2017-01-31 | Discharge: 2017-01-31 | Disposition: A | Discharge status: Home / Self Care | Payer: Medicaid Other | Attending: Emergency Medicine | Admitting: Emergency Medicine

## 2017-01-31 ENCOUNTER — Encounter (HOSPITAL_COMMUNITY): Payer: Self-pay | Admitting: Emergency Medicine

## 2017-01-31 DIAGNOSIS — F17210 Nicotine dependence, cigarettes, uncomplicated: Secondary | ICD-10-CM | POA: Diagnosis not present

## 2017-01-31 DIAGNOSIS — S00531A Contusion of lip, initial encounter: Secondary | ICD-10-CM | POA: Diagnosis not present

## 2017-01-31 DIAGNOSIS — Y939 Activity, unspecified: Secondary | ICD-10-CM | POA: Insufficient documentation

## 2017-01-31 DIAGNOSIS — Z79899 Other long term (current) drug therapy: Secondary | ICD-10-CM | POA: Insufficient documentation

## 2017-01-31 DIAGNOSIS — Y92017 Garden or yard in single-family (private) house as the place of occurrence of the external cause: Secondary | ICD-10-CM | POA: Insufficient documentation

## 2017-01-31 DIAGNOSIS — S0993XA Unspecified injury of face, initial encounter: Secondary | ICD-10-CM | POA: Diagnosis present

## 2017-01-31 DIAGNOSIS — Y999 Unspecified external cause status: Secondary | ICD-10-CM | POA: Insufficient documentation

## 2017-01-31 DIAGNOSIS — Z23 Encounter for immunization: Secondary | ICD-10-CM | POA: Insufficient documentation

## 2017-01-31 MED ORDER — IBUPROFEN 600 MG PO TABS: 600.0000 mg | ORAL_TABLET | Freq: Four times a day (QID) | ORAL | 0 refills | Status: AC | PRN

## 2017-01-31 MED ORDER — IBUPROFEN 800 MG PO TABS
800.0000 mg | ORAL_TABLET | Freq: Once | ORAL | Status: AC
  Administered 2017-01-31: 800 mg via ORAL
  Filled 2017-01-31: qty 1

## 2017-01-31 MED ORDER — TETANUS-DIPHTH-ACELL PERTUSSIS 5-2.5-18.5 LF-MCG/0.5 IM SUSP
0.5000 mL | Freq: Once | INTRAMUSCULAR | Status: AC
  Administered 2017-01-31: 0.5 mL via INTRAMUSCULAR
  Filled 2017-01-31: qty 0.5
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
## 2017-01-31 NOTE — ED Notes (Signed)
Called for patient to go to room 18, no response.

## 2017-01-31 NOTE — ED Notes (Signed)
Patient discharged. AVS reviewed. Patient refused ice pack. Verbalized appreciation of meal and resource materials. Thank you card given with AVS. Pt ambulatory.

## 2017-01-31 NOTE — ED Notes (Signed)
Pt given meal. Ice pack given. Will also provide pt with info on shelters and food pantries.

## 2017-01-31 NOTE — ED Notes (Signed)
[facility]: [facility] Expected date:  Expected time:  Means of arrival:  Comments: 

## 2017-01-31 NOTE — ED Notes (Signed)
Pt was wanting food.  Due to mouth injury I stated that I would have to get permission from MD.  Pt stated that he had no mouth injury he had a head injury and asked me to touch his head.  I explain to him he would have to wait for the MD to give the ok for food.

## 2022-04-04 DEATH — deceased
